# Patient Record
Sex: Female | Born: 1980 | Race: White | Hispanic: No | Marital: Married | Smoking: Current some day smoker
Health system: Southern US, Community
[De-identification: ages and names within clinical notes are randomized; demographics above are authoritative.]

## PROBLEM LIST (undated history)

## (undated) DIAGNOSIS — F419 Anxiety disorder, unspecified: Secondary | ICD-10-CM

## (undated) DIAGNOSIS — G43909 Migraine, unspecified, not intractable, without status migrainosus: Secondary | ICD-10-CM

## (undated) DIAGNOSIS — F319 Bipolar disorder, unspecified: Secondary | ICD-10-CM

## (undated) DIAGNOSIS — J3489 Other specified disorders of nose and nasal sinuses: Secondary | ICD-10-CM

## (undated) HISTORY — PX: ANKLE SURGERY: SHX546

## (undated) HISTORY — DX: Bipolar disorder, unspecified: F31.9

## (undated) HISTORY — DX: Other specified disorders of nose and nasal sinuses: J34.89

## (undated) HISTORY — DX: Anxiety disorder, unspecified: F41.9

## (undated) HISTORY — PX: NO SURGICAL HISTORY: 101002

## (undated) HISTORY — DX: Migraine, unspecified, not intractable, without status migrainosus: G43.909

---

## 2013-04-23 ENCOUNTER — Emergency Department (HOSPITAL_COMMUNITY)
Admission: EM | Admit: 2013-04-23 | Discharge: 2013-04-24 | Disposition: A | Discharge status: Home / Self Care | Payer: BC Managed Care – PPO | Attending: Emergency Medicine | Admitting: Emergency Medicine

## 2013-04-23 ENCOUNTER — Encounter (HOSPITAL_COMMUNITY): Payer: Self-pay | Admitting: *Deleted

## 2013-04-23 DIAGNOSIS — F17200 Nicotine dependence, unspecified, uncomplicated: Secondary | ICD-10-CM | POA: Insufficient documentation

## 2013-04-23 DIAGNOSIS — Z8679 Personal history of other diseases of the circulatory system: Secondary | ICD-10-CM | POA: Insufficient documentation

## 2013-04-23 DIAGNOSIS — G43909 Migraine, unspecified, not intractable, without status migrainosus: Secondary | ICD-10-CM

## 2013-04-23 DIAGNOSIS — F319 Bipolar disorder, unspecified: Secondary | ICD-10-CM | POA: Insufficient documentation

## 2013-04-23 DIAGNOSIS — Z79899 Other long term (current) drug therapy: Secondary | ICD-10-CM | POA: Insufficient documentation

## 2013-04-23 HISTORY — DX: Bipolar disorder, unspecified: F31.9

## 2013-04-23 HISTORY — DX: Migraine, unspecified, not intractable, without status migrainosus: G43.909

## 2013-04-23 MED ORDER — HYDROMORPHONE HCL PF 2 MG/ML IJ SOLN
2.0000 mg | Freq: Once | INTRAMUSCULAR | Status: AC
  Administered 2013-04-23: 2 mg via INTRAMUSCULAR
  Filled 2013-04-23: qty 1

## 2013-04-23 MED ORDER — PROMETHAZINE HCL 25 MG/ML IJ SOLN
25.0000 mg | Freq: Once | INTRAMUSCULAR | Status: AC
  Administered 2013-04-23: 25 mg via INTRAMUSCULAR
  Filled 2013-04-23: qty 1

## 2013-04-23 NOTE — ED Notes (Signed)
Pt states is in pain management for migraines; recently moved here from [place]; percocet did not come with pt; states normally percocet and nausea meds help

## 2013-04-23 NOTE — ED Notes (Signed)
No out of country travel

## 2013-04-24 MED ORDER — DEXAMETHASONE SODIUM PHOSPHATE 10 MG/ML IJ SOLN
10.0000 mg | Freq: Once | INTRAMUSCULAR | Status: AC
  Administered 2013-04-24: 10 mg via INTRAMUSCULAR
  Filled 2013-04-24: qty 1

## 2013-04-24 NOTE — ED Provider Notes (Signed)
CSN: [id number]     Arrival date & time 04/23/13  1953 History   First MD Initiated Contact with Patient 04/23/13 2156     Chief Complaint  Patient presents with  . Headache   (Consider location/radiation/quality/duration/timing/severity/associated sxs/prior Treatment) Patient is a 32 y.o. female presenting with headaches. The history is provided by the patient, the spouse and medical records. No language interpreter was used.  Headache Associated symptoms: no abdominal pain, no back pain, no cough, no diarrhea, no fatigue, no fever, no nausea, no neck stiffness and no vomiting     Michele Combs is a 32 y.o. female  with a hx of hemiplegic migraines, bipolar 1 disorder presents to the Emergency Department complaining of gradual, persistent, progressively worsening migraine headache onset 5 days ago.  Patient reports that she just moved across country on Friday and lost her medications. She states she and her husband are relocating to [place]. She reports that on Thursday of this week she has an appointment with neurology for further evaluation and treatment of her headaches. She also states that she has an appointment with pain management this coming Monday.  She reports that this migraine is like all of her others. She's had several episodes of emesis without diarrhea. She denies fever, chills, neck pain, neck stiffness, rash.  Patient has tried taking home Toradol without relief.  She reports that she only gets relief with Phenergan 50 mg and Dilaudid 3 mg IM.  Patient reports that her migraines are triggered by stress and she believes is the trigger for this migraine. She also denies abdominal pain, diarrhea, weakness, dizziness, lightheadedness, syncope, dysuria, hematuria.    Patient reports that her migraines are always hemiplegic in nature and always involve the right side. She denies having difficulty walking, loss of bowel or bladder control today.  Past Medical History  Diagnosis Date   . Migraines   . Bipolar 1 disorder    Past Surgical History  Procedure Laterality Date  . Ankle surgery     No family history on file. History  Substance Use Topics  . Smoking status: Current Some Day Smoker  . Smokeless tobacco: Not on file  . Alcohol Use: No     Comment: social   OB History   Grav Para Term Preterm Abortions TAB SAB Ect Mult Living                 Review of Systems  Constitutional: Negative for fever, diaphoresis, appetite change, fatigue and unexpected weight change.  HENT: Negative for mouth sores and neck stiffness.   Eyes: Negative for visual disturbance.  Respiratory: Negative for cough, chest tightness, shortness of breath and wheezing.   Cardiovascular: Negative for chest pain.  Gastrointestinal: Negative for nausea, vomiting, abdominal pain, diarrhea and constipation.  Endocrine: Negative for polydipsia, polyphagia and polyuria.  Genitourinary: Negative for dysuria, urgency, frequency and hematuria.  Musculoskeletal: Negative for back pain.  Skin: Negative for rash.  Allergic/Immunologic: Negative for immunocompromised state.  Neurological: Positive for headaches. Negative for syncope and light-headedness.  Hematological: Does not bruise/bleed easily.  Psychiatric/Behavioral: Negative for sleep disturbance. The patient is not nervous/anxious.     Allergies  Benadryl; Erythromycin; and Lamictal  Home Medications   Current Outpatient Rx  Name  Route  Sig  Dispense  Refill  . clonazePAM (KLONOPIN) 2 MG tablet   Oral   Take 1 mg by mouth 4 (four) times daily. scheduled         . FLUoxetine (PROZAC) 20 MG  capsule   Oral   Take 60 mg by mouth daily.         Marland Kitchen lithium carbonate (ESKALITH) 450 MG CR tablet   Oral   Take 900 mg by mouth 2 (two) times daily.         . Lurasidone HCl (LATUDA) 120 MG TABS   Oral   Take 1 tablet by mouth daily.         . ondansetron (ZOFRAN-ODT) 8 MG disintegrating tablet   Oral   Take 16 mg by  mouth every 8 (eight) hours as needed for nausea.         Marland Kitchen oxyCODONE-acetaminophen (PERCOCET) 10-325 MG per tablet   Oral   Take 2 tablets by mouth every 4 (four) hours as needed for pain.         Marland Kitchen PRESCRIPTION MEDICATION   Oral   Take 1 tablet by mouth at bedtime. Birth control. Patient or family member is not sure of the name of the medication.         . propranolol (INDERAL) 20 MG tablet   Oral   Take 20 mg by mouth 3 (three) times daily.          BP 121/81  Pulse 78  Temp(Src) 98.3 F (36.8 C) (Oral)  Resp 20  SpO2 97%  LMP 04/06/2013 Physical Exam  Nursing note and vitals reviewed. Constitutional: She is oriented to person, place, and time. She appears well-developed and well-nourished. No distress.  HENT:  Head: Normocephalic and atraumatic.  Right Ear: Hearing, tympanic membrane, external ear and ear canal normal.  Left Ear: Hearing, tympanic membrane, external ear and ear canal normal.  Nose: Nose normal. No mucosal edema or rhinorrhea.  Mouth/Throat: Uvula is midline, oropharynx is clear and moist and mucous membranes are normal. Mucous membranes are not dry. No edematous. No oropharyngeal exudate, posterior oropharyngeal edema, posterior oropharyngeal erythema or tonsillar abscesses.  Eyes: Conjunctivae and EOM are normal. Pupils are equal, round, and reactive to light. No scleral icterus.  Neck: Normal range of motion. Neck supple.  Cardiovascular: Normal rate, regular rhythm, normal heart sounds and intact distal pulses.   Pulmonary/Chest: Effort normal and breath sounds normal. No respiratory distress. She has no wheezes. She has no rales.  Abdominal: Soft. Bowel sounds are normal. There is no tenderness. There is no rebound and no guarding.  Musculoskeletal: Normal range of motion. She exhibits no tenderness.  Lymphadenopathy:    She has no cervical adenopathy.  Neurological: She is alert and oriented to person, place, and time. She has normal reflexes.  No cranial nerve deficit. She exhibits normal muscle tone. Coordination normal. GCS eye subscore is 4. GCS verbal subscore is 5. GCS motor subscore is 6.  Reflex Scores:      Tricep reflexes are 2+ on the right side and 2+ on the left side.      Bicep reflexes are 2+ on the right side and 2+ on the left side.      Brachioradialis reflexes are 2+ on the right side and 2+ on the left side.      Patellar reflexes are 2+ on the right side and 2+ on the left side.      Achilles reflexes are 2+ on the right side and 2+ on the left side. Speech is clear and goal oriented, follows commands Cranial nerves III - XII without deficit, no facial droop Normal strength lower extremities bilaterally, including strong and equal dorsiflexion and plantarflexion  Patient with  right upper extremity weakness at 3/5 including grip strength as compared to the left - patient reports this is completely normal for her migraines Sensation decreased in the right side of the face and right arm - patient also reports this is normal Moves extremities without ataxia, coordination intact Normal finger to nose and rapid alternating movements Normal gait  Skin: Skin is warm and dry. No rash noted. She is not diaphoretic.  Psychiatric: She has a normal mood and affect. Her behavior is normal. Judgment and thought content normal.    ED Course  Procedures (including critical care time) Labs Review Labs Reviewed - No data to display Imaging Review No results found.  MDM   1. Migraine headache      Michele Combs presents with migraine headache. Patient reports this is a baseline migraine for her and she presents here only because she does have abortive therapy at home. Patient reports that she needs Phenergan and Dilaudid IM for this.  I have agreed to give patient Phenergan 25 mg and Dilaudid 2 mg IM. I discussed at length with her the emergency Department is not a place that will treat headaches a regular basis.  I also  told her that this emergency Department and lives in this region do not treat migraines with her narcotic medications.  I have agreed to treat her with one time and have informed her that further providers will not give her the same medication cocktail. Advised her to talk to both pain management and neurology about the plan for further migraines.  Patient will not be discharged home with narcotic pain medications.  12:32 AM Patient reports she feels much better after treatment.  Hemiplegia resolved completely with resolution of headache.  Normal neurologic exam at this time.  Presentation is like pts typical HA and non concerning for [facility], ICH, Meningitis, or temporal arteritis. Pt is afebrile with no focal neuro deficits (after treatment), nuchal rigidity, or change in vision. Pt is to follow up with neurology and pain management to discuss prophylactic and abortive medications. Pt verbalizes understanding and is agreeable with plan to dc.   It has been determined that no acute conditions requiring further emergency intervention are present at this time. The patient/guardian have been advised of the diagnosis and plan. We have discussed signs and symptoms that warrant return to the ED, such as changes or worsening in symptoms.   Vital signs are stable at discharge.   BP 121/81  Pulse 78  Temp(Src) 98.3 F (36.8 C) (Oral)  Resp 20  SpO2 97%  LMP 04/06/2013  Patient/guardian has voiced understanding and agreed to follow-up with the PCP or specialist.        Dierdre Forth, PA-C 04/24/13 0034

## 2013-04-24 NOTE — ED Provider Notes (Signed)
Medical screening examination/treatment/procedure(s) were performed by non-physician practitioner and as supervising physician I was immediately available for consultation/collaboration.  Adrinne Sze R. Sammantha Mehlhaff, MD 04/24/13 1433 

## 2013-06-13 ENCOUNTER — Encounter (HOSPITAL_COMMUNITY): Payer: Self-pay | Admitting: Emergency Medicine

## 2013-06-13 ENCOUNTER — Emergency Department (HOSPITAL_COMMUNITY)
Admission: EM | Admit: 2013-06-13 | Discharge: 2013-06-13 | Disposition: A | Discharge status: Home / Self Care | Payer: BC Managed Care – PPO | Attending: Emergency Medicine | Admitting: Emergency Medicine

## 2013-06-13 DIAGNOSIS — T43505A Adverse effect of unspecified antipsychotics and neuroleptics, initial encounter: Secondary | ICD-10-CM | POA: Insufficient documentation

## 2013-06-13 DIAGNOSIS — T50905A Adverse effect of unspecified drugs, medicaments and biological substances, initial encounter: Secondary | ICD-10-CM

## 2013-06-13 DIAGNOSIS — F17200 Nicotine dependence, unspecified, uncomplicated: Secondary | ICD-10-CM | POA: Insufficient documentation

## 2013-06-13 DIAGNOSIS — R21 Rash and other nonspecific skin eruption: Secondary | ICD-10-CM | POA: Insufficient documentation

## 2013-06-13 DIAGNOSIS — Z79899 Other long term (current) drug therapy: Secondary | ICD-10-CM | POA: Insufficient documentation

## 2013-06-13 DIAGNOSIS — F319 Bipolar disorder, unspecified: Secondary | ICD-10-CM | POA: Insufficient documentation

## 2013-06-13 DIAGNOSIS — F411 Generalized anxiety disorder: Secondary | ICD-10-CM | POA: Insufficient documentation

## 2013-06-13 DIAGNOSIS — G43909 Migraine, unspecified, not intractable, without status migrainosus: Secondary | ICD-10-CM | POA: Insufficient documentation

## 2013-06-13 DIAGNOSIS — Z3202 Encounter for pregnancy test, result negative: Secondary | ICD-10-CM | POA: Insufficient documentation

## 2013-06-13 LAB — CBC WITH DIFFERENTIAL/PLATELET
Basophils Absolute: 0 10*3/uL (ref 0.0–0.1)
Basophils Relative: 0 % (ref 0–1)
Eosinophils Absolute: 0.2 10*3/uL (ref 0.0–0.7)
Eosinophils Relative: 1 % (ref 0–5)
Lymphocytes Relative: 29 % (ref 12–46)
MCH: 29.9 pg (ref 26.0–34.0)
MCHC: 33.1 g/dL (ref 30.0–36.0)
MCV: 90.6 fL (ref 78.0–100.0)
Monocytes Absolute: 0.8 10*3/uL (ref 0.1–1.0)
Neutrophils Relative %: 63 % (ref 43–77)
Platelets: 390 10*3/uL (ref 150–400)
RBC: 3.94 MIL/uL (ref 3.87–5.11)
RDW: 13.3 % (ref 11.5–15.5)

## 2013-06-13 LAB — BASIC METABOLIC PANEL
Calcium: 9.8 mg/dL (ref 8.4–10.5)
Creatinine, Ser: 0.89 mg/dL (ref 0.50–1.10)
GFR calc Af Amer: >90 mL/min (ref >90)
GFR calc non Af Amer: 85 mL/min — ABNORMAL LOW (ref >90)
Glucose, Bld: 102 mg/dL — ABNORMAL HIGH (ref 70–99)
Potassium: 3.9 mEq/L (ref 3.5–5.1)
Sodium: 140 mEq/L (ref 135–145)

## 2013-06-13 LAB — URINALYSIS, ROUTINE W REFLEX MICROSCOPIC
Hgb urine dipstick: NEGATIVE
Ketones, ur: NEGATIVE mg/dL
Nitrite: NEGATIVE
Protein, ur: NEGATIVE mg/dL
Specific Gravity, Urine: 1.014 (ref 1.005–1.030)
Urobilinogen, UA: 0.2 mg/dL (ref 0.0–1.0)

## 2013-06-13 LAB — POCT PREGNANCY, URINE: Preg Test, Ur: NEGATIVE

## 2013-06-13 LAB — LITHIUM LEVEL: Lithium Lvl: 0.59 mEq/L — ABNORMAL LOW (ref 0.80–1.40)

## 2013-06-13 MED ORDER — LORAZEPAM 2 MG/ML IJ SOLN
1.0000 mg | Freq: Once | INTRAMUSCULAR | Status: AC
  Administered 2013-06-13: 1 mg via INTRAVENOUS
  Filled 2013-06-13: qty 1

## 2013-06-13 MED ORDER — ETOMIDATE 2 MG/ML IV SOLN: INTRAVENOUS | Status: AC | PRN

## 2013-06-13 MED ORDER — ROCURONIUM BROMIDE 50 MG/5ML IV SOLN: INTRAVENOUS | Status: AC | PRN

## 2013-06-13 MED ORDER — HYDROXYZINE HCL 25 MG PO TABS: 25.0000 mg | ORAL_TABLET | Freq: Three times a day (TID) | ORAL | Status: DC | PRN

## 2013-06-13 MED ORDER — HYDROXYZINE HCL 25 MG PO TABS
25.0000 mg | ORAL_TABLET | Freq: Once | ORAL | Status: AC
  Administered 2013-06-13: 25 mg via ORAL
  Filled 2013-06-13: qty 1

## 2013-06-13 NOTE — ED Provider Notes (Signed)
CSN: [id number]     Arrival date & time 06/13/13  0413 History   First MD Initiated Contact with Patient 06/13/13 [phone]     Chief Complaint  Patient presents with  . Anxiety   (Consider location/radiation/quality/duration/timing/severity/associated sxs/prior Treatment) Patient is a 32 y.o. female presenting with rash.  Rash Location: arms. Quality: itchiness   Quality: not painful   Severity:  Moderate Onset quality:  Sudden Duration:  4 hours Timing:  Constant Progression:  Unchanged Chronicity:  New Context comment:  Increased dose of seroquel Relieved by:  Nothing Ineffective treatments:  Antihistamines Associated symptoms: no abdominal pain, no diarrhea, no fever, no nausea, no shortness of breath, no throat swelling, no tongue swelling and not vomiting     Past Medical History  Diagnosis Date  . Migraines   . Bipolar 1 disorder    Past Surgical History  Procedure Laterality Date  . Ankle surgery     No family history on file. History  Substance Use Topics  . Smoking status: Current Some Day Smoker  . Smokeless tobacco: Not on file  . Alcohol Use: No     Comment: social   OB History   Grav Para Term Preterm Abortions TAB SAB Ect Mult Living                 Review of Systems  Constitutional: Negative for fever.  HENT: Negative for congestion.   Respiratory: Negative for cough and shortness of breath.   Cardiovascular: Negative for chest pain.  Gastrointestinal: Negative for nausea, vomiting, abdominal pain and diarrhea.  Skin: Positive for rash.  All other systems reviewed and are negative.    Allergies  Benadryl; Erythromycin; and Lamictal  Home Medications   Current Outpatient Rx  Name  Route  Sig  Dispense  Refill  . clonazePAM (KLONOPIN) 2 MG tablet   Oral   Take 1 mg by mouth 4 (four) times daily. scheduled         . FLUoxetine (PROZAC) 20 MG capsule   Oral   Take 60 mg by mouth daily.         Marland Kitchen lithium carbonate (ESKALITH) 450 MG  CR tablet   Oral   Take 900 mg by mouth 2 (two) times daily.         . Lurasidone HCl (LATUDA) 120 MG TABS   Oral   Take 1 tablet by mouth daily.         . ondansetron (ZOFRAN-ODT) 8 MG disintegrating tablet   Oral   Take 16 mg by mouth every 8 (eight) hours as needed for nausea.         Marland Kitchen oxyCODONE-acetaminophen (PERCOCET) 10-325 MG per tablet   Oral   Take 2 tablets by mouth every 4 (four) hours as needed for pain.         Marland Kitchen PRESCRIPTION MEDICATION   Oral   Take 1 tablet by mouth at bedtime. Birth control. Patient or family member is not sure of the name of the medication.         . propranolol (INDERAL) 20 MG tablet   Oral   Take 20 mg by mouth 3 (three) times daily.         . QUEtiapine (SEROQUEL) 400 MG tablet   Oral   Take 800 mg by mouth at bedtime.          BP 115/75  Pulse 87  Temp(Src) 98.2 F (36.8 C)  Resp 20  SpO2 97%  LMP 05/30/2013  Physical Exam  Nursing note and vitals reviewed. Constitutional: She is oriented to person, place, and time. She appears well-developed and well-nourished. No distress.  HENT:  Head: Normocephalic and atraumatic.  Mouth/Throat: Oropharynx is clear and moist.  Eyes: Conjunctivae are normal. Pupils are equal, round, and reactive to light. No scleral icterus.  Neck: Neck supple.  Cardiovascular: Normal rate, regular rhythm, normal heart sounds and intact distal pulses.   No murmur heard. Pulmonary/Chest: Effort normal and breath sounds normal. No stridor. No respiratory distress. She has no rales.  Abdominal: Soft. Bowel sounds are normal. She exhibits no distension. There is no tenderness.  Musculoskeletal: Normal range of motion.  Neurological: She is alert and oriented to person, place, and time.  Skin: Skin is warm and dry. No rash noted.  Skin colored papular rash on BUEs.  Mild excoriations.   Psychiatric: She has a normal mood and affect. Her behavior is normal.    ED Course  Procedures (including  critical care time) Labs Review Labs Reviewed  CBC WITH DIFFERENTIAL - Abnormal; Notable for the following:    WBC 12.0 (*)    Hemoglobin 11.8 (*)    HCT 35.7 (*)    All other components within normal limits  BASIC METABOLIC PANEL - Abnormal; Notable for the following:    Glucose, Bld 102 (*)    GFR calc non Af Amer 85 (*)    All other components within normal limits  LITHIUM LEVEL - Abnormal; Notable for the following:    Lithium Lvl 0.59 (*)    All other components within normal limits  URINALYSIS, ROUTINE W REFLEX MICROSCOPIC  POCT PREGNANCY, URINE   Imaging Review No results found.  EKG Interpretation   None       MDM   1. Medication adverse effect, initial encounter    32 yo female with hx of anxiety who yesterday took an increased dose of seroquel yesterday after she saw her psychiatrist regarding worsening anxiety.  She appears calm, but itching.  Possibly secondary to new Seroquel dose.  She has no signs of allergic reaction (no hives, throat closing, SOB, nausea, vomiting, syncope.)  Ativan given for anxiety and atarax given for itching.     Candyce Churn, MD 06/13/13 0730

## 2013-06-13 NOTE — ED Notes (Signed)
Pt reports taking 75 mg PO Benadryl at 0400 for itching tonight.

## 2013-06-13 NOTE — ED Notes (Signed)
Pt went to psychiatrist today due to increased anxiety; states increased seroquel and klonopin; states feels worse tonight with itching

## 2013-06-13 NOTE — ED Notes (Signed)
POCT PREG resulted NEG. 

## 2013-06-13 NOTE — ED Notes (Addendum)
Another RN to give medication at this time.

## 2013-08-24 ENCOUNTER — Ambulatory Visit (INDEPENDENT_AMBULATORY_CARE_PROVIDER_SITE_OTHER): Payer: BC Managed Care – PPO

## 2013-08-24 VITALS — BP 122/64 | HR 79 | Resp 12

## 2013-08-24 DIAGNOSIS — M7750 Other enthesopathy of unspecified foot: Secondary | ICD-10-CM

## 2013-08-24 DIAGNOSIS — M79609 Pain in unspecified limb: Secondary | ICD-10-CM

## 2013-08-24 DIAGNOSIS — M779 Enthesopathy, unspecified: Secondary | ICD-10-CM

## 2013-08-24 DIAGNOSIS — M7670 Peroneal tendinitis, unspecified leg: Secondary | ICD-10-CM

## 2013-08-24 MED ORDER — MELOXICAM 15 MG PO TABS: 15.0000 mg | ORAL_TABLET | Freq: Every day | ORAL | Status: DC

## 2013-08-24 NOTE — Progress Notes (Signed)
   Subjective:    Patient ID: Michele Combs, female    DOB: 1981/02/19, 33 y.o.   MRN: [id number]  HPI '' RT FOOT ANKLE START BOTHERING ME FOR ABOUT 1 YEAR AND IS HURTING.'' Patient currently head injury requiring a screw fixation of the posterior malleolus fracture. Presents this time with some copies of x-rays or e-mail to us however only have 3 pictures and MRI and several x-ray pictures reveals screw fixation was unremarkable x-rays also detect an os trigonum which patient was told is the cause of her problems.   Review of Systems  Constitutional: Positive for unexpected weight change.  Musculoskeletal: Positive for joint swelling.  All other systems reviewed and are negative.       Objective:   Physical Exam Okay objective findings follows vascular status is intact pedal pulses palpable DP and PT +2/4 bilateral Refill timed 3-4 seconds bilateral normal plantar response DTRs not elicited dermatologically skin color pigment normal hair growth absent there is a scar from previous surgery posterior lateral right ankle. X-rays do reveal intact screw fixation in the posterior malleolus. 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 There is no medial ankle pain however there is significant tenderness along the lateral malleolar area along the distribution of a post peroneal tendons extending even to the level of the fifth metatarsal base. Posse some soft tissue swelling noted and on active and passive range of motion exercises patient has significant pain on utilizing her peroneal tendon and the version and/or flexion or plantarflexion. Inversion was not painful or passive inversion was tender when she tried resistant to this is significant for possible peroneal tendon injury cannot rule out split tear. The MRI pictures that we have are limited and show no significant  finding correlates with her pain at this time.       Assessment & Plan:  Assessment peroneal tendinitis with possible capsulitis lateral ankle at this time patient placed and ankle stabilizer for the next one to 2 months also prescription for Mobic is dispensed recommend a warm compresses and ice pack and moderated activities notable listed 50s running or jumping treadmill and bicycle or water activities or allowed. Reappointed in 2 months for followup and reevaluation and be candidate for future MRI to reevaluate for possible peroneal tendon injury also discussed possibly some of the pain may be associated with reaction to the metal screw fixation. Extra graft Alvan Dameichard Yurani Fettes DPM

## 2013-08-24 NOTE — Patient Instructions (Signed)
ICE INSTRUCTIONS  Apply ice or cold pack to the affected area at least 3 times a day for 10-15 minutes each time.  You should also use ice after prolonged activity or vigorous exercise.  Do not apply ice longer than 20 minutes at one time.  Always keep a cloth between your skin and the ice pack to prevent burns.  Being consistent and following these instructions will help control your symptoms.  We suggest you purchase a gel ice pack because they are reusable and do bit leak.  Some of them are designed to wrap around the area.  Use the method that works best for you.  Here are some other suggestions for icing.   Use a frozen bag of peas or corn-inexpensive and molds well to your body, usually stays frozen for 10 to 20 minutes.  Wet a towel with cold water and squeeze out the excess until it's damp.  Place in a bag in the freezer for 20 minutes. Then remove and use.  Alternate hot warm compress for 15 minutes and the ice for 15 minutes repeat this process 2 or 3 times daily for the next 2-4 weeks

## 2013-09-01 ENCOUNTER — Emergency Department (HOSPITAL_COMMUNITY)
Admission: EM | Admit: 2013-09-01 | Discharge: 2013-09-01 | Disposition: A | Discharge status: Home / Self Care | Payer: BC Managed Care – PPO | Attending: Emergency Medicine | Admitting: Emergency Medicine

## 2013-09-01 ENCOUNTER — Encounter (HOSPITAL_COMMUNITY): Payer: Self-pay | Admitting: Emergency Medicine

## 2013-09-01 DIAGNOSIS — F17200 Nicotine dependence, unspecified, uncomplicated: Secondary | ICD-10-CM | POA: Insufficient documentation

## 2013-09-01 DIAGNOSIS — G43909 Migraine, unspecified, not intractable, without status migrainosus: Secondary | ICD-10-CM | POA: Insufficient documentation

## 2013-09-01 DIAGNOSIS — R0789 Other chest pain: Secondary | ICD-10-CM | POA: Insufficient documentation

## 2013-09-01 DIAGNOSIS — F319 Bipolar disorder, unspecified: Secondary | ICD-10-CM | POA: Insufficient documentation

## 2013-09-01 DIAGNOSIS — Z79899 Other long term (current) drug therapy: Secondary | ICD-10-CM | POA: Insufficient documentation

## 2013-09-01 DIAGNOSIS — T50905A Adverse effect of unspecified drugs, medicaments and biological substances, initial encounter: Secondary | ICD-10-CM

## 2013-09-01 DIAGNOSIS — T43505A Adverse effect of unspecified antipsychotics and neuroleptics, initial encounter: Secondary | ICD-10-CM | POA: Insufficient documentation

## 2013-09-01 MED ORDER — HYDROXYZINE HCL 25 MG PO TABS
50.0000 mg | ORAL_TABLET | Freq: Once | ORAL | Status: AC
  Administered 2013-09-01: 50 mg via ORAL
  Filled 2013-09-01: qty 2

## 2013-09-01 MED ORDER — LORAZEPAM 2 MG/ML IJ SOLN
1.0000 mg | Freq: Once | INTRAMUSCULAR | Status: AC
  Administered 2013-09-01: 1 mg via INTRAVENOUS
  Filled 2013-09-01: qty 1

## 2013-09-01 MED ORDER — LORAZEPAM 1 MG PO TABS
1.0000 mg | ORAL_TABLET | Freq: Once | ORAL | Status: DC
  Filled 2013-09-01: qty 1

## 2013-09-01 MED ORDER — HYDROXYZINE HCL 25 MG PO TABS: 25.0000 mg | ORAL_TABLET | Freq: Four times a day (QID) | ORAL | Status: DC

## 2013-09-01 MED ORDER — ALBUTEROL SULFATE HFA 108 (90 BASE) MCG/ACT IN AERS
2.0000 | INHALATION_SPRAY | RESPIRATORY_TRACT | Status: DC | PRN
  Administered 2013-09-01: 2 via RESPIRATORY_TRACT
  Filled 2013-09-01: qty 6.7

## 2013-09-01 NOTE — ED Provider Notes (Signed)
CSN: [id number]     Arrival date & time 09/01/13  0000 History   First MD Initiated Contact with Patient 09/01/13 0052     Chief Complaint  Patient presents with  . Allergic Reaction     (Consider location/radiation/quality/duration/timing/severity/associated sxs/prior Treatment) HPI History provided by patient. Has history of migraines and bipolar. Has been taking a new medication for the last month, saphris. Tonight at home developed itching upper extremities and some chest tightness and believes that she is allergic to this medication.  Had previous reaction to Seroquel with similar symptoms. At that time she received Vistaril which helped and today she is requesting the same. No rash. No wheezing. No leg pain or leg swelling. No other known new exposures including foods, medications, soaps or detergents. Symptoms moderate in severity. Past Medical History  Diagnosis Date  . Migraines   . Bipolar 1 disorder    Past Surgical History  Procedure Laterality Date  . Ankle surgery     History reviewed. No pertinent family history. History  Substance Use Topics  . Smoking status: Current Some Day Smoker  . Smokeless tobacco: Not on file  . Alcohol Use: No     Comment: social   OB History   Grav Para Term Preterm Abortions TAB SAB Ect Mult Living                 Review of Systems  Constitutional: Negative for fever and chills.  Eyes: Negative for visual disturbance.  Respiratory: Negative for shortness of breath.   Cardiovascular: Negative for chest pain.  Gastrointestinal: Negative for vomiting and abdominal pain.  Genitourinary: Negative for dysuria.  Musculoskeletal: Negative for back pain and neck stiffness.  Skin: Negative for rash.  Neurological: Negative for syncope, weakness and headaches.  All other systems reviewed and are negative.      Allergies  Benadryl; Erythromycin; Lamictal; Saphris; and Seroquel  Home Medications   Current Outpatient Rx  Name   Route  Sig  Dispense  Refill  . clonazePAM (KLONOPIN) 2 MG tablet   Oral   Take 2 mg by mouth 3 (three) times daily as needed. scheduled         . doxepin (SINEQUAN) 10 MG/ML solution   Oral   Take 150 mg by mouth at bedtime.          Marland Kitchen. FLUoxetine (PROZAC) 20 MG capsule   Oral   Take 60 mg by mouth daily.         . hydrOXYzine (ATARAX/VISTARIL) 25 MG tablet   Oral   Take 1 tablet (25 mg total) by mouth every 8 (eight) hours as needed for anxiety or itching.   12 tablet   0   . LEVONEST tablet   Oral   Take 1 tablet by mouth daily.          . Lurasidone HCl (LATUDA) 120 MG TABS   Oral   Take 1 tablet by mouth daily.         . ondansetron (ZOFRAN-ODT) 8 MG disintegrating tablet   Oral   Take 16 mg by mouth every 8 (eight) hours as needed for nausea.         Marland Kitchen. oxyCODONE-acetaminophen (PERCOCET) 10-325 MG per tablet   Oral   Take 2 tablets by mouth every 4 (four) hours as needed for pain.         Marland Kitchen. propranolol (INDERAL) 20 MG tablet   Oral   Take 20 mg by mouth 3 (three) times  daily.         . zolmitriptan (ZOMIG) 5 MG tablet   Oral   Take 5 mg by mouth as needed.          . zonisamide (ZONEGRAN) 100 MG capsule   Oral   Take 100 mg by mouth daily.          Marland Kitchen asenapine (SAPHRIS) 5 MG SUBL 24 hr tablet   Sublingual   Place 10 mg under the tongue 2 (two) times daily.          BP 133/76  Pulse 86  Temp(Src) 97.8 F (36.6 C) (Oral)  Resp 18  Ht 5' 4.5" (1.638 m)  Wt 222 lb (100.699 kg)  BMI 37.53 kg/m2  SpO2 98%  LMP 08/15/2013 Physical Exam  Constitutional: She is oriented to person, place, and time. She appears well-developed and well-nourished.  HENT:  Head: Normocephalic and atraumatic.  Mouth/Throat: Oropharynx is clear and moist.  Uvula midline  Eyes: EOM are normal. Pupils are equal, round, and reactive to light.  Neck: Neck supple.  Cardiovascular: Normal rate, regular rhythm and intact distal pulses.   Pulmonary/Chest:  Effort normal and breath sounds normal. No stridor. No respiratory distress. She has no wheezes.  Abdominal: Soft. Bowel sounds are normal. She exhibits no distension. There is no tenderness.  Musculoskeletal: Normal range of motion. She exhibits no edema and no tenderness.  Neurological: She is alert and oriented to person, place, and time. She displays normal reflexes. No cranial nerve deficit. Coordination normal.  No involuntary movements, MAE x 4 appropriately  Skin: Skin is warm and dry. No rash noted.  Psychiatric: She has a normal mood and affect. Her behavior is normal. Judgment normal.    ED Course  Procedures (including critical care time) Labs Review  Vistaril, albuterol provided Patient feeling anxious requesting something for her nerves, Ativan given  On recheck feels improved. Plan discharge home with prescription for Vistaril close physician followup. Patient will stop Saphris.    MDM   Diagnosis: Adverse drug reaction  No anaphylaxis. Treated with medications as above and condition improved Vital signs / nursing notes reviewed and considered    Sunnie Nielsen, MD 09/01/13 [phone]

## 2013-09-01 NOTE — Discharge Instructions (Signed)

## 2013-09-01 NOTE — ED Notes (Signed)
Pt reports shortness of breath, chest tightness, itching and involuntary spasms that began approx 3hrs ago - pt is attributing her symptoms to an allergic reaction from her bipolar medication - Saphis. Pt A&Ox4, no acute resp distress, handling secretions and speaking complete sentences.

## 2013-09-03 ENCOUNTER — Other Ambulatory Visit: Payer: Self-pay | Admitting: Neurology

## 2013-09-03 DIAGNOSIS — R51 Headache: Principal | ICD-10-CM

## 2013-09-03 DIAGNOSIS — R519 Headache, unspecified: Secondary | ICD-10-CM

## 2013-09-05 ENCOUNTER — Other Ambulatory Visit: Payer: Self-pay

## 2013-09-05 ENCOUNTER — Telehealth: Payer: Self-pay | Admitting: *Deleted

## 2013-09-05 MED ORDER — MELOXICAM 15 MG PO TABS: 15.0000 mg | ORAL_TABLET | Freq: Every day | ORAL | Status: DC

## 2013-09-05 NOTE — Telephone Encounter (Signed)
Sent in Meloxicam Rx to [facility] as requested. Attempted to inform pt that this was done but her VM was "full" so unable to leave a message.

## 2013-09-13 ENCOUNTER — Other Ambulatory Visit: Payer: BC Managed Care – PPO

## 2013-10-19 ENCOUNTER — Ambulatory Visit: Payer: BC Managed Care – PPO

## 2014-03-22 ENCOUNTER — Encounter (HOSPITAL_COMMUNITY): Payer: Self-pay | Admitting: Emergency Medicine

## 2014-03-22 ENCOUNTER — Emergency Department (HOSPITAL_COMMUNITY)
Admission: EM | Admit: 2014-03-22 | Discharge: 2014-03-23 | Disposition: A | Discharge status: Home / Self Care | Payer: Medicare Other | Attending: Emergency Medicine | Admitting: Emergency Medicine

## 2014-03-22 DIAGNOSIS — Z3202 Encounter for pregnancy test, result negative: Secondary | ICD-10-CM | POA: Insufficient documentation

## 2014-03-22 DIAGNOSIS — Z79899 Other long term (current) drug therapy: Secondary | ICD-10-CM | POA: Insufficient documentation

## 2014-03-22 DIAGNOSIS — Z87891 Personal history of nicotine dependence: Secondary | ICD-10-CM | POA: Insufficient documentation

## 2014-03-22 DIAGNOSIS — F319 Bipolar disorder, unspecified: Secondary | ICD-10-CM | POA: Insufficient documentation

## 2014-03-22 DIAGNOSIS — R112 Nausea with vomiting, unspecified: Secondary | ICD-10-CM | POA: Insufficient documentation

## 2014-03-22 DIAGNOSIS — G43909 Migraine, unspecified, not intractable, without status migrainosus: Secondary | ICD-10-CM | POA: Insufficient documentation

## 2014-03-22 DIAGNOSIS — R519 Headache, unspecified: Secondary | ICD-10-CM

## 2014-03-22 DIAGNOSIS — Z9889 Other specified postprocedural states: Secondary | ICD-10-CM

## 2014-03-22 DIAGNOSIS — R51 Headache: Secondary | ICD-10-CM

## 2014-03-22 LAB — CBC
HCT: 40.2 % (ref 36.0–46.0)
Hemoglobin: 13 g/dL (ref 12.0–15.0)
MCH: 30.5 pg (ref 26.0–34.0)
MCHC: 32.3 g/dL (ref 30.0–36.0)
MCV: 94.4 fL (ref 78.0–100.0)
Platelets: 379 10*3/uL (ref 150–400)
RBC: 4.26 MIL/uL (ref 3.87–5.11)
RDW: 12.8 % (ref 11.5–15.5)
WBC: 10.4 10*3/uL (ref 4.0–10.5)

## 2014-03-22 LAB — BASIC METABOLIC PANEL
Anion gap: 17 — ABNORMAL HIGH (ref 5–15)
BUN: 15 mg/dL (ref 6–23)
CO2: 21 meq/L (ref 19–32)
Calcium: 9.9 mg/dL (ref 8.4–10.5)
Chloride: 103 mEq/L (ref 96–112)
Creatinine, Ser: 0.73 mg/dL (ref 0.50–1.10)
GFR calc Af Amer: >90 mL/min (ref >90)
Glucose, Bld: 83 mg/dL (ref 70–99)
POTASSIUM: 4.5 meq/L (ref 3.7–5.3)
SODIUM: 141 meq/L (ref 137–147)

## 2014-03-22 LAB — POC URINE PREG, ED: PREG TEST UR: NEGATIVE

## 2014-03-22 MED ORDER — PROMETHAZINE HCL 25 MG PO TABS
12.5000 mg | ORAL_TABLET | ORAL | Status: AC
  Administered 2014-03-22: 12.5 mg via ORAL

## 2014-03-22 MED ORDER — HYDROMORPHONE HCL PF 1 MG/ML IJ SOLN
1.0000 mg | Freq: Once | INTRAMUSCULAR | Status: AC
  Administered 2014-03-22: 1 mg via INTRAVENOUS
  Filled 2014-03-22: qty 1

## 2014-03-22 MED ORDER — SODIUM CHLORIDE 0.9 % IV BOLUS (SEPSIS)
1000.0000 mL | Freq: Once | INTRAVENOUS | Status: AC
  Administered 2014-03-22: 1000 mL via INTRAVENOUS

## 2014-03-22 MED ORDER — PROMETHAZINE HCL 25 MG/ML IJ SOLN
12.5000 mg | Freq: Once | INTRAMUSCULAR | Status: AC
  Administered 2014-03-22: 12.5 mg via INTRAVENOUS
  Filled 2014-03-22: qty 1

## 2014-03-22 MED ORDER — HYDROMORPHONE HCL PF 2 MG/ML IJ SOLN
2.0000 mg | Freq: Once | INTRAMUSCULAR | Status: AC
  Administered 2014-03-22: 2 mg via INTRAVENOUS
  Filled 2014-03-22: qty 1

## 2014-03-22 NOTE — ED Provider Notes (Signed)
CSN: [id number]     Arrival date & time 03/22/14  1930 History   First MD Initiated Contact with Patient 03/22/14 2119     Chief Complaint  Patient presents with  . Migraine     (Consider location/radiation/quality/duration/timing/severity/associated sxs/prior Treatment) HPI Comments: The patient is a 33 year old female past no history of migraines, bipolar disorder, presents emergency room chief complaint of headache since today. The patient reports after undergoing ECT she had a headache. She reports 20-30 previous ECT therapy is in the past. She reports she was given Toradol and tramadol without full resolution of symptoms. The patient reports she has had a migraine unrelieved by tramadol and Ultram in the past after ECT therapy she has required narcotic pain medication. She reports multiple episodes of vomiting no abdominal discomfort. No visual, weakness, numbness. LNMP: now. Neurologist in [place], rest of medical care in [place].   The history is provided by the patient. No language interpreter was used.    Past Medical History  Diagnosis Date  . Migraines   . Bipolar 1 disorder    Past Surgical History  Procedure Laterality Date  . Ankle surgery     No family history on file. History  Substance Use Topics  . Smoking status: Former Games developer  . Smokeless tobacco: Not on file  . Alcohol Use: Yes     Comment: social   OB History   Grav Para Term Preterm Abortions TAB SAB Ect Mult Living                 Review of Systems  Constitutional: Negative for fever and chills.  Eyes: Negative for visual disturbance.  Gastrointestinal: Positive for nausea and vomiting. Negative for abdominal pain and diarrhea.  Musculoskeletal: Negative for neck pain.  Neurological: Positive for headaches. Negative for dizziness, syncope, facial asymmetry, speech difficulty, weakness and light-headedness.      Allergies  Benadryl; Erythromycin; Lamictal; Saphris; and Seroquel  Home  Medications   Prior to Admission medications   Medication Sig Start Date End Date Taking? Authorizing Provider  clonazePAM (KLONOPIN) 2 MG tablet Take 2 mg by mouth 3 (three) times daily as needed. scheduled   Yes Historical Provider, MD  doxepin (SINEQUAN) 10 MG/ML solution Take 150 mg by mouth at bedtime as needed for sleep.  07/24/13  Yes Historical Provider, MD  FLUoxetine (PROZAC) 20 MG capsule Take 80 mg by mouth daily.    Yes Historical Provider, MD  LEVONEST tablet Take 1 tablet by mouth daily.  08/12/13  Yes Historical Provider, MD  lithium carbonate (ESKALITH) 450 MG CR tablet Take 900 mg by mouth 2 (two) times daily.   Yes Historical Provider, MD  ondansetron (ZOFRAN-ODT) 8 MG disintegrating tablet Take 16 mg by mouth every 8 (eight) hours as needed for nausea.   Yes Historical Provider, MD  oxyCODONE-acetaminophen (PERCOCET) 10-325 MG per tablet Take 2 tablets by mouth every 4 (four) hours as needed for pain.   Yes Historical Provider, MD  propranolol (INDERAL) 20 MG tablet Take 20 mg by mouth 3 (three) times daily.   Yes Historical Provider, MD  zolpidem (AMBIEN) 10 MG tablet Take 10 mg by mouth at bedtime as needed for sleep.  02/27/14  Yes Historical Provider, MD   BP 139/96  Pulse 72  Temp(Src) 98.8 F (37.1 C) (Oral)  Resp 18  Ht  (1.626 m)  Wt 180 lb (81.647 kg)  BMI 30.88 kg/m2  SpO2 100%  LMP 03/17/2014 Physical Exam  Nursing note and  vitals reviewed. Constitutional: She is oriented to person, place, and time. She appears well-developed and well-nourished. No distress.  HENT:  Head: Normocephalic and atraumatic.  Eyes: EOM are normal. Pupils are equal, round, and reactive to light.  Dilated pupils bilaterally  Neck: Normal range of motion. Neck supple.  Cardiovascular: Normal rate and regular rhythm.   Pulmonary/Chest: Effort normal and breath sounds normal. No respiratory distress. She has no wheezes. She has no rales.  Abdominal: Soft. There is no tenderness.  There is no rebound.  Neurological: She is oriented to person, place, and time. No cranial nerve deficit. Coordination normal.  Speech is clear and goal oriented, follows commands Cranial nerves III - XII grossly intact, no facial droop Normal strength in upper and lower extremities bilaterally, strong and equal grip strength Sensation normal to light and sharp touch Moves all 4 extremities without ataxia, coordination intact Normal finger to nose and rapid alternating movements Normal gait without assistance.  Skin: Skin is warm and dry. She is not diaphoretic.  Psychiatric: Her behavior is normal.    ED Course  Procedures (including critical care time) Labs Review Results for orders placed during the hospital encounter of 03/22/14  CBC      Result Value Ref Range   WBC 10.4  4.0 - 10.5 K/uL   RBC 4.26  3.87 - 5.11 MIL/uL   Hemoglobin 13.0  12.0 - 15.0 g/dL   HCT 16.1  09.6 - 04.5 %   MCV 94.4  78.0 - 100.0 fL   MCH 30.5  26.0 - 34.0 pg   MCHC 32.3  30.0 - 36.0 g/dL   RDW 40.9  81.1 - 91.4 %   Platelets 379  150 - 400 K/uL  BASIC METABOLIC PANEL      Result Value Ref Range   Sodium 141  137 - 147 mEq/L   Potassium 4.5  3.7 - 5.3 mEq/L   Chloride 103  96 - 112 mEq/L   CO2 21  19 - 32 mEq/L   Glucose, Bld 83  70 - 99 mg/dL   BUN 15  6 - 23 mg/dL   Creatinine, Ser 7.82  0.50 - 1.10 mg/dL   Calcium 9.9  8.4 - 95.6 mg/dL   GFR calc non Af Amer >90  >90 mL/min   GFR calc Af Amer >90  >90 mL/min   Anion gap 17 (*) 5 - 15  POC URINE PREG, ED      Result Value Ref Range   Preg Test, Ur NEGATIVE  NEGATIVE   No results found  Imaging Review No results found.   EKG Interpretation None      MDM   Final diagnoses:  Acute nonintractable headache, unspecified headache type  History of electroconvulsive therapy   Patient presents after ECT treatment with migraine, and vomiting, similar to previous. Afebrile. Neurology is in [place] no neurologic deficits on exam.  Patient has had Ultram and tramadol prior to arrival. To treat nausea and headache. And reassess. Labs show anion gap of 17 likely do to vomiting patient is getting IV fluids at this time. Reevaluation patient resting comfortably in room reports mild resolution with Dilaudid reports persistent nausea and pain. No further emesis. 2350 reevaluation patient resting in room reports resolution of headache, requesting to be discharged home. Discussed lab results, and treatment plan with the patient. Resources given. Return precautions given. Reports understanding and no other concerns at this time.  Patient is stable for discharge at this time.  Meds  given in ED:  Medications  sodium chloride 0.9 % bolus 1,000 mL (1,000 mLs Intravenous New Bag/Given 03/22/14 2215)  HYDROmorphone (DILAUDID) injection 1 mg (1 mg Intravenous Given 03/22/14 2222)  promethazine (PHENERGAN) injection 12.5 mg (12.5 mg Intravenous Given 03/22/14 2223)  promethazine (PHENERGAN) tablet 12.5 mg (12.5 mg Oral Given 03/22/14 2340)  HYDROmorphone (DILAUDID) injection 2 mg (2 mg Intravenous Given 03/22/14 2341)    New Prescriptions   No medications on file      Mellody Drown, PA-C 03/23/14 0015

## 2014-03-22 NOTE — ED Notes (Signed)
Pt requesting to have Phenergan and Dilaudid IM instead of IV. Mellody Drown PA says that's fine.

## 2014-03-22 NOTE — ED Notes (Signed)
Pt presents with migraine that began post ECT treatment this am @ 0900. +n/v. +light and sound sensitivity.

## 2014-03-22 NOTE — Discharge Instructions (Signed)
Call for a follow up appointment with a Family or Primary Care Provider.  °Return if Symptoms worsen.   °Take medication as prescribed.  °Drink plenty of fluids. ° ° °Emergency Department Resource Guide °1) Find a Doctor and Pay Out of Pocket °Although you won't have to find out who is covered by your insurance plan, it is a good idea to ask around and get recommendations. You will then need to call the office and see if the doctor you have chosen will accept you as a new patient and what types of options they offer for patients who are self-pay. Some doctors offer discounts or will set up payment plans for their patients who do not have insurance, but you will need to ask so you aren't surprised when you get to your appointment. ° °2) Contact Your Local Health Department °Not all health departments have doctors that can see patients for sick visits, but many do, so it is worth a call to see if yours does. If you don't know where your local health department is, you can check in your phone book. The CDC also has a tool to help you locate your state's health department, and many state websites also have listings of all of their local health departments. ° °3) Find a Walk-in Clinic °If your illness is not likely to be very severe or complicated, you may want to try a walk in clinic. These are popping up all over the country in pharmacies, drugstores, and shopping centers. They're usually staffed by nurse practitioners or physician assistants that have been trained to treat common illnesses and complaints. They're usually fairly quick and inexpensive. However, if you have serious medical issues or chronic medical problems, these are probably not your best option. ° °No Primary Care Doctor: °- Call [facility] Connect at  832-8000 - they can help you locate a primary care doctor that  accepts your insurance, provides certain services, etc. °- Physician Referral Service- 1-800-533-3463 ° °Chronic Pain  Problems: °Organization         Address  Phone   Notes  °[facility]  (336) [phone] Patients need to be referred by their primary care doctor.  ° °Medication Assistance: °Organization         Address  Phone   Notes  °[place] [facility] [address] °[place], [place] [address] (336) 641-8030 --Must be a resident of [place] °-- Must have NO insurance coverage whatsoever (no Medicaid/ Medicare, etc.) °-- The pt. MUST have a primary care doctor that directs their care regularly and follows them in the community °  °[facility]  (866) 331-1348   °[employer]  (888) 892-1162   ° °Agencies that provide inexpensive medical care: °Organization         Address  Phone   Notes  °[facility]  (336) 832-8035   °[facility]    (336) 832-7272   °[facility] [address] °[place], [place] [address] (336) 832-4777   °[facility] [address], °[place] (336) 271-4999   °[facility]    (336) 373-0678   °[facility]    (336) 272-1050   °[facility] ° [address],  Phone:  (336) 832-4444, Fax:  (336) 832-4440 Hours of Operation:  9 am - 6 pm, M-F.  Also accepts Medicaid/Medicare and self-pay.  °[facility] ° [address], [place] Phone: (336) [phone], Fax: (336) [phone]. Hours of Operation:  8:30 am - 5:30 pm, M-F.  Also accepts Medicaid and self-pay.  °[facility] [place] [address], [place] Phone: (336) 878-6027   °[facility] [address], [place], [place] (336)723-1848, Ext. 123 Mondays & Thursdays: 7-9 AM.  First 15 patients are seen on a first come, first serve basis. °  ° °Medicaid-accepting [place] Providers: ° °Organization         Address  Phone   Notes  °[facility] [address], [place] (336) 641-2100 Also  accepts self-pay patients.  °[facility] [address], [place] ° (336) 856-9996   °[facility] [address], [place] (336) 288-8857   °[facility] [address] [place] [address], [place] (336) 299-7000   °Veita Bland [address], [place]  ° (336) 373-1557 Only accepts [place] Access Medicaid patients after they have their name applied to their card.  ° °Self-Pay (no insurance) in [place]: ° °Organization         Address  Phone   Notes  °Sickle Cell Patients, [facility] [address], [place] (336) 832-1970   °[facility] [address], [place] (336) 832-4400   °[facility] ° [address], [place] (336) 992-4800   °[facility]/Dr. Osei-Bonsu ° [address] [place] [address], [place] or [address], [place] (336) 841-8500 Phone number for both [place] and [place] locations is the same.  °[facility] [address], [place] (336) 299-0000   °[facility] [address] [place] [address], [place] or [address] (336) 852-7530 °(336) 878-2260   °[facility] [address], [place] (336) 350-1642, phone; (336) 294-5005, fax Sees patients 1st and 3rd Saturday of every month.  Must not qualify for public or private insurance (i.e. Medicaid, Medicare, [place] Health Choice, Veterans' Benefits) • Household income should be no more than 200% of the poverty level •The clinic cannot treat you if you are pregnant or think you are pregnant • Sexually transmitted diseases are not treated at the clinic.  ° ° °Dental Care: °Organization         Address  Phone  Notes  °[facility] [address], [place] (336) 641-6152 Accepts children up to age 21 who are enrolled in Medicaid or [place] Health Choice; pregnant  women with a Medicaid card; and children who have applied for Medicaid or [place] Health Choice, but were declined, whose parents can pay a reduced fee at time of service.  °[facility] [place]  [address], [place] (336) 641-7733 Accepts children up to age 21 who are enrolled in Medicaid or [place] Health Choice; pregnant women with a Medicaid card; and children who have applied for Medicaid or  Health Choice, but were declined, whose parents can pay a reduced fee at time of service.  °[facility] ° [address], [place] (336) 641-4533 Patients are seen by appointment only. Walk-ins are not accepted. [facility] will see patients 18 years of age and older. °Monday - Tuesday (8am-5pm) °Most Wednesdays (8:30-5pm) °$30 per visit, cash only  °[facility] °   [address], [place] (336) 641-4533 Patients are seen by appointment only. Walk-ins are not accepted. [facility] will see patients 18 years of age and older. °One Wednesday Evening (Monthly: Volunteer Based).  $30 per visit, cash only  °[facility]  (919) 537-3737 for adults; Children under age 4, call Graduate Pediatric Dentistry at (919) [phone]. Children aged 4-14, please call (919) [phone] to request a pediatric application. ° Dental services are provided in all areas of dental care including fillings, crowns and bridges, complete and partial dentures, implants, gum treatment, root canals, and extractions. Preventive care is also provided. Treatment is provided to both adults and children. °Patients are selected via a lottery and there is often a waiting list. °  °[facility] [address], °[place] ° (336) 763-8833 [email or web address] °  °[facility] [address], [place], [place] (336)723-1848, Ext. 123 Second and Fourth Thursday of each month, opens at 6:30 AM; Clinic ends at 9 AM.  Patients are  seen on a first-come first-served basis, and a limited number are seen during each clinic.  ° °[facility] ° [address], [place], [place] (336) 723-7904   Eligibility Requirements °You must have lived in [place], [place], or [place] counties for at least the last three months. °  You cannot be eligible for state or federal sponsored healthcare insurance, including Veterans Administration, Medicaid, or Medicare. °  You generally cannot be eligible for healthcare insurance through your employer.  °  How to apply: °Eligibility screenings are held every Tuesday and Wednesday afternoon from 1:00 pm until 4:00 pm. You do not need an appointment for the interview!  °[facility] [address], [place], [place] 336-631-2330   °[facility]  336-342-8273   °[facility]  336-703-3100   °[facility]  336-570-6415   ° °Behavioral Health Resources in the Community: °Intensive Outpatient Programs °Organization         Address  Phone  Notes  °[facility] [address], [place], [place] 336-878-6098   °[facility] [address], [place], [place] 336-832-9800   °[facility] [address], [place], [place] ° 336-882-2125   °[facility] [address],  °[place], [place] 1-800-853-5163 or 336-641-4981   °Substance Abuse Resources °Organization         Address  Phone  Notes  °[facility]  336-882-2125   °[facility]  336-784-9470   °[facility]  336-285-9073   °[facility]  336-845-3988   °Residential & Outpatient Substance Abuse Program  1-800-659-3381   °Psychological Services °Organization         Address  Phone  Notes  °[facility]  336- 832-9600   °[facility]  336- 378-7881   °[facility] [address], [place] 1-800-853-5163 or 336-641-4981   ° °Mobile Crisis  Teams °Organization         Address  Phone  Notes  °[facility], Mobile Crisis Care Unit  1-877-626-1772   °Assertive °[facility] ° [address] [place], [place] 336-834-9664   °Sharon DeEsch [address] °[place] 336-554-5454   ° °Self-Help/Support Groups °Organization         Address  Phone             Notes  °[facility]  -   variety of support groups  336- 373-1402 Call for more information  °Narcotics Anonymous (NA), [facility] [address], °[place]  2 meetings at this location  ° °Residential Treatment Programs °Organization         Address  Phone  Notes  °[facility] [address],    °[place]  1-866-801-8205   °[facility] ° [address], [place], [place] 704-293-8524   °[facility] [address], [place] 336-845-3988 Admissions: 8am-3pm M-F  °[facility] [address]    °[place], [place] 336-841-1104   °[facility] [address], [place], [place] 336-379-7146   °[facility] [address]  °[place], [place] 336-285-9073   °[facility] - Intensive Outpatient [address], [place], [place] 336-852-3033   °[facility] [address]  °[place], [place] 1-877-615-2722 or 336-784-9470   °[facility] [address] [place], [place] 336-227-7417 Accepts Medicaid  °[facility] [address]  °[place] 1-800-659-3381 Substance Abuse/Addiction Treatment  ° °[place] Behavioral Health Resources °Organization         Address  Phone  Notes  °[facility]  (888) 581-9988   °Julie Brannon, PhD [address] [place], [place]   (336) 349-5553 or (336) 951-0000   °[facility]   [address] °[place], [place] (336) 349-4454   °[facility] [address], [place], [place] (336) 342-8316  Insurance/Medicaid/sponsorship through [facility]  °[facility] [address]                                    [place], [place] (336) 342-8316 Therapy/tele-psych/case  °[facility] [address]  ° [place], [place] (336) 349-2233    °Dr. Arfeen  (336) 349-4544   °[facility] [place]  [employer] [place] [facility] 1) [address], [place] °2) [address], [place] °3)  [address], [place] (336) 349-3220 °(336) 342-7768 ° °(336) 342-8140   °[place] [facility] (336) 342-1394 or (336) 342-3537 (After Hours)    ° ° ° °

## 2014-03-23 NOTE — ED Provider Notes (Signed)
Medical screening examination/treatment/procedure(s) were performed by non-physician practitioner and as supervising physician I was immediately available for consultation/collaboration.   EKG Interpretation None        Layla Maw Saron Vanorman, DO 03/23/14 1610

## 2014-05-30 ENCOUNTER — Emergency Department (HOSPITAL_COMMUNITY)
Admission: EM | Admit: 2014-05-30 | Discharge: 2014-05-31 | Disposition: A | Discharge status: Home / Self Care | Payer: Medicare Other | Attending: Emergency Medicine | Admitting: Emergency Medicine

## 2014-05-30 ENCOUNTER — Emergency Department (HOSPITAL_COMMUNITY): Payer: Medicare Other

## 2014-05-30 ENCOUNTER — Encounter (HOSPITAL_COMMUNITY): Payer: Self-pay | Admitting: *Deleted

## 2014-05-30 DIAGNOSIS — F319 Bipolar disorder, unspecified: Secondary | ICD-10-CM | POA: Diagnosis not present

## 2014-05-30 DIAGNOSIS — Z8701 Personal history of pneumonia (recurrent): Secondary | ICD-10-CM | POA: Insufficient documentation

## 2014-05-30 DIAGNOSIS — G43909 Migraine, unspecified, not intractable, without status migrainosus: Secondary | ICD-10-CM | POA: Diagnosis present

## 2014-05-30 DIAGNOSIS — Z79899 Other long term (current) drug therapy: Secondary | ICD-10-CM | POA: Insufficient documentation

## 2014-05-30 DIAGNOSIS — R05 Cough: Secondary | ICD-10-CM | POA: Insufficient documentation

## 2014-05-30 DIAGNOSIS — R0602 Shortness of breath: Secondary | ICD-10-CM | POA: Diagnosis not present

## 2014-05-30 DIAGNOSIS — G43809 Other migraine, not intractable, without status migrainosus: Secondary | ICD-10-CM | POA: Diagnosis not present

## 2014-05-30 DIAGNOSIS — Z720 Tobacco use: Secondary | ICD-10-CM | POA: Diagnosis not present

## 2014-05-30 DIAGNOSIS — R059 Cough, unspecified: Secondary | ICD-10-CM

## 2014-05-30 DIAGNOSIS — R1110 Vomiting, unspecified: Secondary | ICD-10-CM | POA: Diagnosis not present

## 2014-05-30 LAB — CBC WITH DIFFERENTIAL/PLATELET
Basophils Absolute: 0 10*3/uL (ref 0.0–0.1)
Basophils Relative: 0 % (ref 0–1)
EOS PCT: 1 % (ref 0–5)
Eosinophils Absolute: 0.1 10*3/uL (ref 0.0–0.7)
HCT: 38.5 % (ref 36.0–46.0)
Hemoglobin: 12.7 g/dL (ref 12.0–15.0)
LYMPHS ABS: 3.2 10*3/uL (ref 0.7–4.0)
LYMPHS PCT: 42 % (ref 12–46)
MCH: 29.8 pg (ref 26.0–34.0)
MCHC: 33 g/dL (ref 30.0–36.0)
MCV: 90.4 fL (ref 78.0–100.0)
Monocytes Absolute: 0.4 10*3/uL (ref 0.1–1.0)
Monocytes Relative: 5 % (ref 3–12)
Neutro Abs: 4 10*3/uL (ref 1.7–7.7)
Neutrophils Relative %: 52 % (ref 43–77)
PLATELETS: 365 10*3/uL (ref 150–400)
RBC: 4.26 MIL/uL (ref 3.87–5.11)
RDW: 13.3 % (ref 11.5–15.5)
WBC: 7.7 10*3/uL (ref 4.0–10.5)

## 2014-05-30 LAB — COMPREHENSIVE METABOLIC PANEL
ALK PHOS: 77 U/L (ref 39–117)
ALT: 14 U/L (ref 0–35)
AST: 16 U/L (ref 0–37)
Albumin: 3.8 g/dL (ref 3.5–5.2)
Anion gap: 15 (ref 5–15)
BUN: 10 mg/dL (ref 6–23)
CALCIUM: 9.1 mg/dL (ref 8.4–10.5)
CHLORIDE: 107 meq/L (ref 96–112)
CO2: 20 meq/L (ref 19–32)
Creatinine, Ser: 0.84 mg/dL (ref 0.50–1.10)
GFR calc Af Amer: >90 mL/min (ref >90)
GLUCOSE: 104 mg/dL — AB (ref 70–99)
POTASSIUM: 4.5 meq/L (ref 3.7–5.3)
SODIUM: 142 meq/L (ref 137–147)
Total Bilirubin: 0.3 mg/dL (ref 0.3–1.2)
Total Protein: 7.5 g/dL (ref 6.0–8.3)

## 2014-05-30 MED ORDER — BENZONATATE 100 MG PO CAPS: 100.0000 mg | ORAL_CAPSULE | Freq: Three times a day (TID) | ORAL | Status: AC

## 2014-05-30 MED ORDER — PROMETHAZINE HCL 25 MG/ML IJ SOLN
50.0000 mg | Freq: Once | INTRAMUSCULAR | Status: AC
  Administered 2014-05-30: 50 mg via INTRAMUSCULAR
  Filled 2014-05-30: qty 2

## 2014-05-30 MED ORDER — METOCLOPRAMIDE HCL 5 MG/ML IJ SOLN
10.0000 mg | Freq: Once | INTRAMUSCULAR | Status: AC
  Administered 2014-05-30: 10 mg via INTRAVENOUS
  Filled 2014-05-30: qty 2

## 2014-05-30 MED ORDER — HYDROMORPHONE HCL 1 MG/ML IJ SOLN
1.0000 mg | Freq: Once | INTRAMUSCULAR | Status: DC
  Filled 2014-05-30: qty 1

## 2014-05-30 MED ORDER — HYDROMORPHONE HCL 1 MG/ML IJ SOLN
1.0000 mg | INTRAMUSCULAR | Status: DC | PRN
  Administered 2014-05-30 (×2): 1 mg via INTRAMUSCULAR
  Filled 2014-05-30: qty 1

## 2014-05-30 MED ORDER — KETOROLAC TROMETHAMINE 30 MG/ML IJ SOLN
30.0000 mg | Freq: Once | INTRAMUSCULAR | Status: AC
  Administered 2014-05-30: 30 mg via INTRAVENOUS
  Filled 2014-05-30: qty 1

## 2014-05-30 MED ORDER — HYDROMORPHONE HCL 2 MG/ML IJ SOLN: 2.0000 mg | Freq: Once | INTRAMUSCULAR | Status: DC

## 2014-05-30 MED ORDER — PROMETHAZINE HCL 25 MG PO TABS: 25.0000 mg | ORAL_TABLET | Freq: Four times a day (QID) | ORAL | Status: AC | PRN

## 2014-05-30 MED ORDER — SODIUM CHLORIDE 0.9 % IV BOLUS (SEPSIS)
1000.0000 mL | Freq: Once | INTRAVENOUS | Status: AC
  Administered 2014-05-30: 1000 mL via INTRAVENOUS

## 2014-05-30 NOTE — ED Notes (Signed)
Pt does not have to urinate at this time.  

## 2014-05-30 NOTE — ED Provider Notes (Signed)
CSN: [id number]     Arrival date & time 05/30/14  1843 History   First MD Initiated Contact with Patient 05/30/14 2157     Chief Complaint  Patient presents with  . Emesis  . Migraine  . Pneumonia      HPI  Vision presents with multiple complaints. [facility] or last 3 weeks. Had a chest x-ray with her initial diagnosis 3 weeks ago. Continues take anabolic processes nauseating to her peers been vomiting for last 2 days. Patient has a "migraine headache" continues with a cough. Not short of breath. Clear sputum.  Past Medical History  Diagnosis Date  . Migraines   . Bipolar 1 disorder    Past Surgical History  Procedure Laterality Date  . Ankle surgery     No family history on file. History  Substance Use Topics  . Smoking status: Current Some Day Smoker  . Smokeless tobacco: Not on file  . Alcohol Use: Yes     Comment: social   OB History    No data available     Review of Systems  Constitutional: Negative for fever, chills, diaphoresis, appetite change and fatigue.  HENT: Negative for mouth sores, sore throat and trouble swallowing.   Eyes: Negative for visual disturbance.  Respiratory: Positive for shortness of breath. Negative for cough, chest tightness and wheezing.   Cardiovascular: Negative for chest pain.  Gastrointestinal: Negative for nausea, vomiting, abdominal pain, diarrhea and abdominal distention.  Endocrine: Negative for polydipsia, polyphagia and polyuria.  Genitourinary: Negative for dysuria, frequency and hematuria.  Musculoskeletal: Negative for gait problem.  Skin: Negative for color change, pallor and rash.  Neurological: Positive for headaches. Negative for dizziness, syncope and light-headedness.  Hematological: Does not bruise/bleed easily.  Psychiatric/Behavioral: Negative for behavioral problems and confusion.      Allergies  Benadryl; Erythromycin; Lamictal; Saphris; and  Seroquel  Home Medications   Prior to Admission medications   Medication Sig Start Date End Date Taking? Authorizing Provider  acetaminophen (TYLENOL) 500 MG tablet Take 1,500 mg by mouth every 6 (six) hours as needed for moderate pain.   Yes Historical Provider, MD  albuterol (PROVENTIL HFA;VENTOLIN HFA) 108 (90 BASE) MCG/ACT inhaler Inhale 2 puffs into the lungs every 6 (six) hours as needed for wheezing or shortness of breath.   Yes Historical Provider, MD  clonazePAM (KLONOPIN) 2 MG tablet Take 2 mg by mouth 3 (three) times daily as needed for anxiety. scheduled   Yes Historical Provider, MD  doxepin (SINEQUAN) 10 MG/ML solution Take 150 mg by mouth at bedtime as needed for sleep.  07/24/13  Yes Historical Provider, MD  FLUoxetine (PROZAC) 20 MG capsule Take 80 mg by mouth daily.    Yes Historical Provider, MD  LEVONEST tablet Take 1 tablet by mouth daily.  08/12/13  Yes Historical Provider, MD  lithium carbonate (ESKALITH) 450 MG CR tablet Take 900 mg by mouth 2 (two) times daily.   Yes Historical Provider, MD  naproxen sodium (ANAPROX) 220 MG tablet Take 660 mg by mouth 2 (two) times daily as needed (pain).   Yes Historical Provider, MD  ondansetron (ZOFRAN-ODT) 8 MG disintegrating tablet Take 16 mg by mouth every 8 (eight) hours as needed for nausea.   Yes Historical Provider, MD  oxyCODONE-acetaminophen (PERCOCET) 10-325 MG per tablet Take 2 tablets by mouth every 4 (four) hours as needed for pain.   Yes Historical Provider, MD  propranolol (INDERAL) 20 MG tablet Take  20 mg by mouth daily as needed. Anxiety   Yes Historical Provider, MD  zolpidem (AMBIEN) 10 MG tablet Take 10 mg by mouth at bedtime as needed for sleep.  02/27/14  Yes Historical Provider, MD  benzonatate (TESSALON) 100 MG capsule Take 1 capsule (100 mg total) by mouth every 8 (eight) hours. 05/30/14   Rolland PorterMark Madigan Rosensteel, MD  promethazine (PHENERGAN) 25 MG tablet Take 1 tablet (25 mg total) by mouth every 6 (six) hours as needed for  nausea or vomiting. 05/30/14   Rolland PorterMark Jhene Westmoreland, MD   BP 133/68 mmHg  Pulse 67  Temp(Src) 98.2 F (36.8 C) (Oral)  Resp 16  Ht 5\' 4"  (1.626 m)  Wt 185 lb (83.915 kg)  BMI 31.74 kg/m2  SpO2 100%  LMP 05/13/2014 Physical Exam  Constitutional: She is oriented to person, place, and time. She appears well-developed and well-nourished. No distress.  HENT:  Head: Normocephalic.  Eyes: Conjunctivae are normal. Pupils are equal, round, and reactive to light. No scleral icterus.  Neck: Normal range of motion. Neck supple. No thyromegaly present.  Cardiovascular: Normal rate and regular rhythm.  Exam reveals no gallop and no friction rub.   No murmur heard. Pulmonary/Chest: Effort normal and breath sounds normal. No respiratory distress. She has no wheezes. She has no rales.  Abdominal: Soft. Bowel sounds are normal. She exhibits no distension. There is no tenderness. There is no rebound.  Musculoskeletal: Normal range of motion.  Neurological: She is alert and oriented to person, place, and time.  Skin: Skin is warm and dry. No rash noted.  Psychiatric: She has a normal mood and affect. Her behavior is normal.    ED Course  Procedures (including critical care time) Labs Review Labs Reviewed  COMPREHENSIVE METABOLIC PANEL - Abnormal; Notable for the following:    Glucose, Bld 104 (*)    All other components within normal limits  CBC WITH DIFFERENTIAL    Imaging Review Dg Chest 2 View  05/30/2014   CLINICAL DATA:  Cough several weeks.  EXAM: CHEST  2 VIEW  COMPARISON:  None.  FINDINGS: Lungs are clear. Cardiomediastinal silhouette is within normal. Bones and soft tissues are unremarkable.  IMPRESSION: No active cardiopulmonary disease.   Electronically Signed   By: Elberta Fortisaniel  Boyle M.D.   On: 05/30/2014 22:36     EKG Interpretation None      MDM   Final diagnoses:  Cough  Other migraine without status migrainosus, not intractable    Chest x-ray shows no pneumonia. Not hypoxic or  febrile. Given IV fluids. Given IM Dilaudid and IM Phenergan. Plan be discharged home with Phenergan and also present. Cipro antibiotic as she has no sign of pneumonia currently.    Rolland PorterMark Latonia Conrow, MD 05/30/14 [phone]

## 2014-05-30 NOTE — ED Notes (Signed)
Pt states that she was diagnosed with Pneumonia approx 3 weeks ago; pt states that she still feels short of breath, productive cough of green sputum and just isn;t feeling any better; pt states that she is in her 3rd round of antibiotics; pt is currently on Levaquin and has taken a Z-Pack and Augmentin in the last 3 weeks; pt c/o N/V and states "I can't keep anything down"; pt reports 20 episodes of vomiting today; pt c/o diarrhea as well; pt states that she has chronic migraines and the coughing and vomiting has caused a "severe" migraine

## 2014-05-30 NOTE — Discharge Instructions (Signed)
Stop your antibiotic. Phenergan for nausea.  Migraine Headache A migraine headache is an intense, throbbing pain on one or both sides of your head. A migraine can last for 30 minutes to several hours. CAUSES  The exact cause of a migraine headache is not always known. However, a migraine may be caused when nerves in the brain become irritated and release chemicals that cause inflammation. This causes pain. Certain things may also trigger migraines, such as:  Alcohol.  Smoking.  Stress.  Menstruation.  Aged cheeses.  Foods or drinks that contain nitrates, glutamate, aspartame, or tyramine.  Lack of sleep.  Chocolate.  Caffeine.  Hunger.  Physical exertion.  Fatigue.  Medicines used to treat chest pain (nitroglycerine), birth control pills, estrogen, and some blood pressure medicines. SIGNS AND SYMPTOMS  Pain on one or both sides of your head.  Pulsating or throbbing pain.  Severe pain that prevents daily activities.  Pain that is aggravated by any physical activity.  Nausea, vomiting, or both.  Dizziness.  Pain with exposure to bright lights, loud noises, or activity.  General sensitivity to bright lights, loud noises, or smells. Before you get a migraine, you may get warning signs that a migraine is coming (aura). An aura may include:  Seeing flashing lights.  Seeing bright spots, halos, or zigzag lines.  Having tunnel vision or blurred vision.  Having feelings of numbness or tingling.  Having trouble talking.  Having muscle weakness. DIAGNOSIS  A migraine headache is often diagnosed based on:  Symptoms.  Physical exam.  A CT scan or MRI of your head. These imaging tests cannot diagnose migraines, but they can help rule out other causes of headaches. TREATMENT Medicines may be given for pain and nausea. Medicines can also be given to help prevent recurrent migraines.  HOME CARE INSTRUCTIONS  Only take over-the-counter or prescription  medicines for pain or discomfort as directed by your health care provider. The use of long-term narcotics is not recommended.  Lie down in a dark, quiet room when you have a migraine.  Keep a journal to find out what may trigger your migraine headaches. For example, write down:  What you eat and drink.  How much sleep you get.  Any change to your diet or medicines.  Limit alcohol consumption.  Quit smoking if you smoke.  Get 7-9 hours of sleep, or as recommended by your health care provider.  Limit stress.  Keep lights dim if bright lights bother you and make your migraines worse. SEEK IMMEDIATE MEDICAL CARE IF:   Your migraine becomes severe.  You have a fever.  You have a stiff neck.  You have vision loss.  You have muscular weakness or loss of muscle control.  You start losing your balance or have trouble walking.  You feel faint or pass out.  You have severe symptoms that are different from your first symptoms. MAKE SURE YOU:   Understand these instructions.  Will watch your condition.  Will get help right away if you are not doing well or get worse. Document Released: 07/05/2005 Document Revised: 11/19/2013 Document Reviewed: 03/12/2013 [facility] Patient Information 2015 [employer] [place]. This information is not intended to replace advice given to you by your health care provider. Make sure you discuss any questions you have with your health care provider.

## 2014-05-30 NOTE — ED Notes (Signed)
MD at bedside. 

## 2014-06-25 ENCOUNTER — Encounter (HOSPITAL_COMMUNITY): Payer: Self-pay | Admitting: Emergency Medicine

## 2014-06-25 ENCOUNTER — Emergency Department (HOSPITAL_COMMUNITY)
Admission: EM | Admit: 2014-06-25 | Discharge: 2014-06-25 | Disposition: A | Discharge status: Home / Self Care | Payer: Medicare Other | Attending: Emergency Medicine | Admitting: Emergency Medicine

## 2014-06-25 DIAGNOSIS — Z720 Tobacco use: Secondary | ICD-10-CM | POA: Diagnosis not present

## 2014-06-25 DIAGNOSIS — G43909 Migraine, unspecified, not intractable, without status migrainosus: Secondary | ICD-10-CM | POA: Diagnosis present

## 2014-06-25 DIAGNOSIS — G43009 Migraine without aura, not intractable, without status migrainosus: Secondary | ICD-10-CM | POA: Diagnosis not present

## 2014-06-25 DIAGNOSIS — F319 Bipolar disorder, unspecified: Secondary | ICD-10-CM | POA: Insufficient documentation

## 2014-06-25 DIAGNOSIS — Z79899 Other long term (current) drug therapy: Secondary | ICD-10-CM | POA: Insufficient documentation

## 2014-06-25 MED ORDER — KETOROLAC TROMETHAMINE 60 MG/2ML IM SOLN
60.0000 mg | Freq: Once | INTRAMUSCULAR | Status: AC
  Administered 2014-06-25: 60 mg via INTRAMUSCULAR
  Filled 2014-06-25: qty 2

## 2014-06-25 MED ORDER — PROMETHAZINE HCL 25 MG/ML IJ SOLN
25.0000 mg | Freq: Once | INTRAMUSCULAR | Status: AC
  Administered 2014-06-25: 25 mg via INTRAVENOUS
  Filled 2014-06-25: qty 1

## 2014-06-25 NOTE — ED Notes (Signed)
Bed: WA13 Expected date:  Expected time:  Means of arrival:  Comments: EMS syncope 

## 2014-06-25 NOTE — ED Notes (Signed)
Pt c/o migraine that has been constant since Friday.  Pt said ECT causes them and she had one of Friday.  Pt has blurred visios, n/v.  Pt requesting IM meds bc work better and longer than IV medications.

## 2014-06-25 NOTE — Discharge Instructions (Signed)
Return to the ED with any concerns including vomiting and not able to keep down liquids, weakness of arms or legs, changes in vision or speech, fainting, decreased level of alertness/lethargy, or any other alarming symptoms °

## 2014-06-25 NOTE — ED Provider Notes (Signed)
CSN: [id number]     Arrival date & time 06/25/14  1016 History   First MD Initiated Contact with Patient 06/25/14 1047     Chief Complaint  Patient presents with  . Migraine     (Consider location/radiation/quality/duration/timing/severity/associated sxs/prior Treatment) HPI  Pt presents with c/o migraine headache.  She states she has hx of migraines and this is similar to her prior migraines.  ECT therapy was the trigger and headache started 4 days ago- she has had similar trigger by ECT in the past.  No fever.  She states she has had nausea and vomiting and has not been able to take her po meds at home.  Pt has no focal weakness or numbness.  No changes in vision or speech.  Pt requests phenergan and dilaudid IM- [place].  She has not followed up, does not have current PMD, neurologist or pain management doctor.  There are no other associated systemic symptoms, there are no other alleviating or modifying factors.   Past Medical History  Diagnosis Date  . Migraines   . Bipolar 1 disorder    Past Surgical History  Procedure Laterality Date  . Ankle surgery     No family history on file. History  Substance Use Topics  . Smoking status: Current Some Day Smoker    Types: Cigarettes  . Smokeless tobacco: Not on file  . Alcohol Use: Yes     Comment: social   OB History    No data available     Review of Systems  ROS reviewed and all otherwise negative except for mentioned in HPI    Allergies  Benadryl; Erythromycin; Lamictal; Saphris; and Seroquel  Home Medications   Prior to Admission medications   Medication Sig Start Date End Date Taking? Authorizing Provider  acetaminophen (TYLENOL) 500 MG tablet Take 1,500 mg by mouth every 6 (six) hours as needed for moderate pain.   Yes Historical Provider, MD  albuterol (PROVENTIL HFA;VENTOLIN HFA) 108 (90 BASE) MCG/ACT inhaler Inhale 2 puffs into the lungs every 6  (six) hours as needed for wheezing or shortness of breath.   Yes Historical Provider, MD  clonazePAM (KLONOPIN) 2 MG tablet Take 2 mg by mouth 3 (three) times daily as needed for anxiety. scheduled   Yes Historical Provider, MD  doxepin (SINEQUAN) 10 MG/ML solution Take 150 mg by mouth at bedtime as needed for sleep.  07/24/13  Yes Historical Provider, MD  FLUoxetine (PROZAC) 20 MG capsule Take 80 mg by mouth daily.    Yes Historical Provider, MD  LEVONEST tablet Take 1 tablet by mouth daily.  08/12/13  Yes Historical Provider, MD  lithium carbonate (ESKALITH) 450 MG CR tablet Take 900 mg by mouth 2 (two) times daily.   Yes Historical Provider, MD  naproxen sodium (ANAPROX) 220 MG tablet Take 660 mg by mouth 2 (two) times daily as needed (pain).   Yes Historical Provider, MD  ondansetron (ZOFRAN-ODT) 8 MG disintegrating tablet Take 16 mg by mouth every 8 (eight) hours as needed for nausea.   Yes Historical Provider, MD  oxyCODONE-acetaminophen (PERCOCET) 10-325 MG per tablet Take 2 tablets by mouth every 4 (four) hours as needed for pain.   Yes Historical Provider, MD  promethazine (PHENERGAN) 25 MG tablet Take 1 tablet (25 mg total) by mouth every 6 (six) hours as needed for nausea or vomiting. 05/30/14  Yes Rolland PorterMark James, MD  propranolol (INDERAL) 20 MG tablet Take 20  mg by mouth daily as needed. Anxiety   Yes Historical Provider, MD  zolpidem (AMBIEN) 10 MG tablet Take 10 mg by mouth at bedtime as needed for sleep.  02/27/14  Yes Historical Provider, MD  benzonatate (TESSALON) 100 MG capsule Take 1 capsule (100 mg total) by mouth every 8 (eight) hours. Patient not taking: Reported on 06/25/2014 05/30/14   Rolland PorterMark James, MD   BP 128/75 mmHg  Pulse 61  Temp(Src) 98.4 F (36.9 C) (Oral)  Resp 16  SpO2 100%  LMP 05/01/2014  Vitals reviewed Physical Exam  Physical Examination: General appearance - alert, well appearing, and in no distress Mental status - alert, oriented to person, place, and time Eyes -  pupils equal and reactive, extraocular eye movements intact Neck - supple, no significant adenopathy Chest - clear to auscultation, no wheezes, rales or rhonchi, symmetric air entry Heart - normal rate, regular rhythm, normal S1, S2, no murmurs, rubs, clicks or gallops Neurological - alert, oriented x 3, cranial nerves 2-12 tested and intact, strength 5/5 in extremities x 4, sensation intact Extremities - peripheral pulses normal, no pedal edema, no clubbing or cyanosis Skin - normal coloration and turgor, no rashes  ED Course  Procedures (including critical care time)  12:07 PM pt states she is ready for discharge Labs Review Labs Reviewed - No data to display  Imaging Review No results found.   EKG Interpretation None      MDM   Final diagnoses:  Migraine without aura and without status migrainosus, not intractable    Pt presenting with c/o migraine similar to her prior migraine headaches, normal neuro exam.  No red flags for [facility] or other acute intracranial emergency.    Pt requesting dilaudid for migraine headache.  I have discussed with her that dilaudid is not indicated for treatment of migraine and that I will not be ordering dilaudid.  She is willing to try toradol and phenergan.  States nothing else works for her.  Per prior chart review, she has received dilaudid in the ED here, however note from 10/14 states she was told she would not be given narcotics for migraine in our ED system.     Ethelda ChickMartha K Linker, MD 06/25/14 [phone]

## 2016-01-10 IMAGING — CR DG CHEST 2V
2 series · 2 of 2 positions shown · non-contrast
Comparison: None.

CLINICAL DATA: Cough several weeks.

EXAM:
CHEST  2 VIEW

[w chest pa]
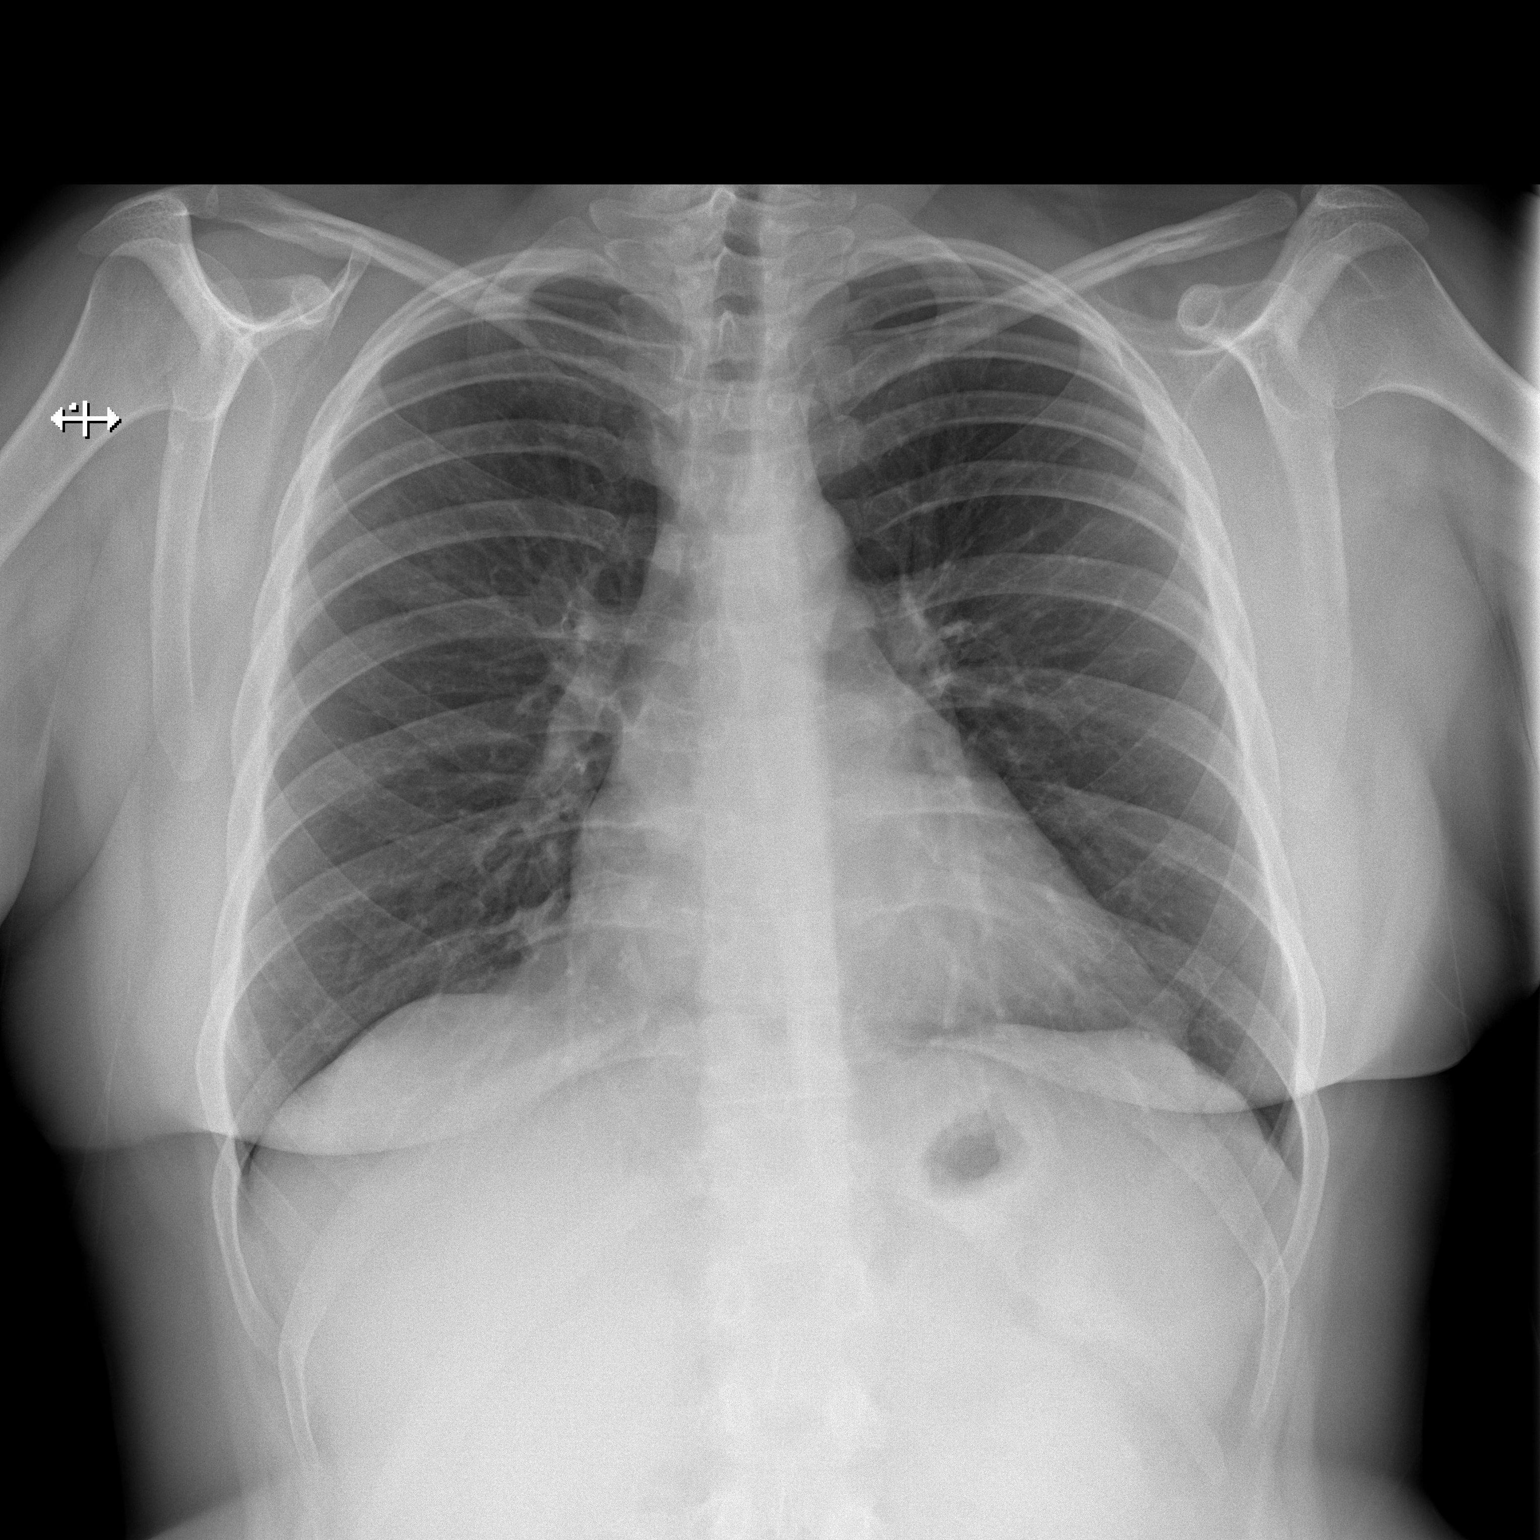

[w chest lat]
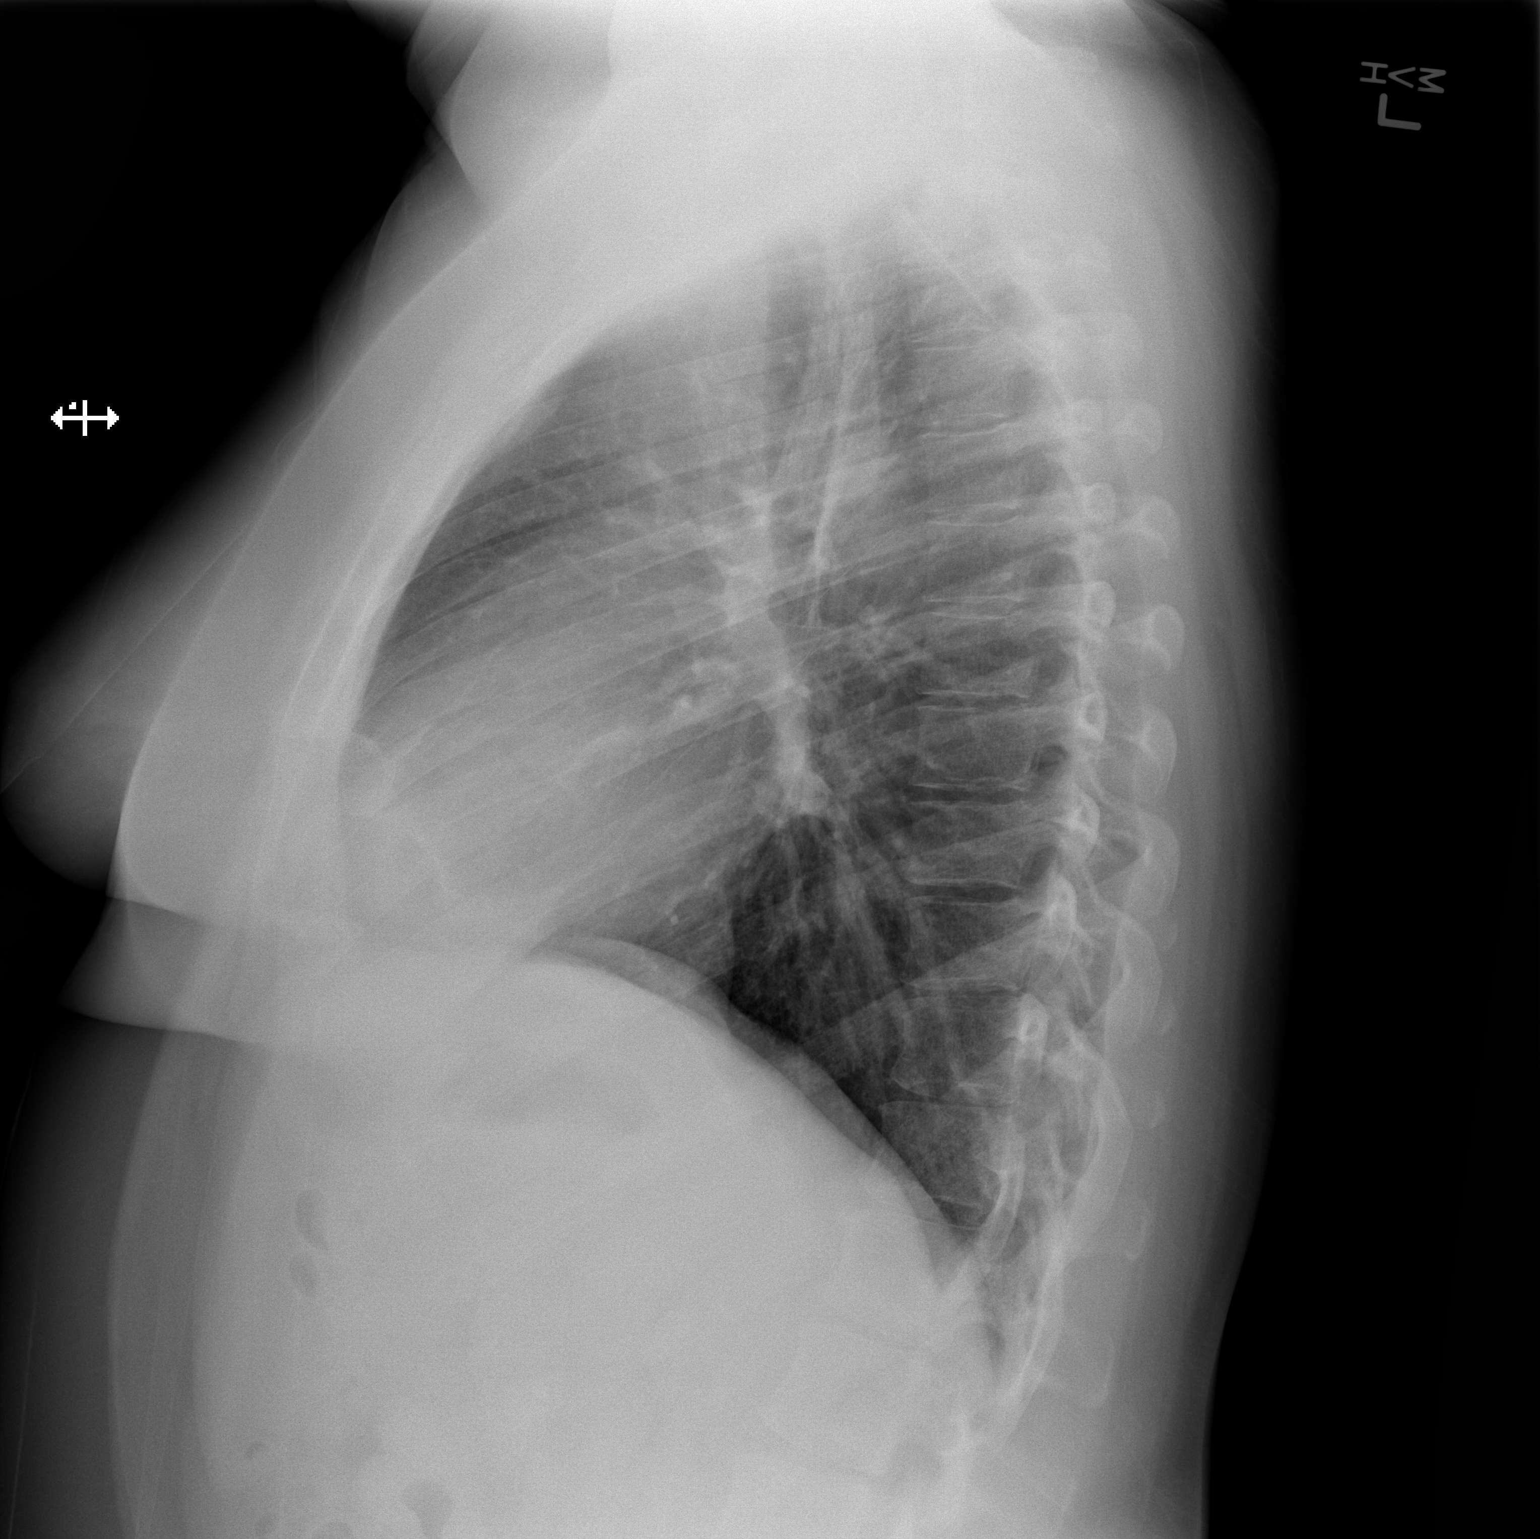

[2 of 2 positions shown; findings below may reference images not displayed]

FINDINGS: Lungs are clear. Cardiomediastinal silhouette is within normal.
Bones and soft tissues are unremarkable.
IMPRESSION: No active cardiopulmonary disease.

## 2020-11-03 LAB — COVID-19 (OUTSIDE ORGANIZATION): COVID-19 Results (Outside Organization): NOT DETECTED

## 2021-02-24 ENCOUNTER — Ambulatory Visit: Admit: 2021-02-24 | Discharge: 2021-02-24 | Disposition: A | Discharge status: Home / Self Care | Payer: Self-pay

## 2021-05-21 ENCOUNTER — Ambulatory Visit: Admit: 2021-05-21 | Discharge: 2021-05-21 | Disposition: A | Discharge status: Home / Self Care | Payer: Self-pay

## 2021-05-21 LAB — COVID-19 (OUTSIDE ORGANIZATION): COVID-19 Results (Outside Organization): NOT DETECTED

## 2021-06-16 ENCOUNTER — Encounter: Payer: Self-pay | Admitting: Otolaryngology

## 2021-06-16 DIAGNOSIS — J3489 Other specified disorders of nose and nasal sinuses: Secondary | ICD-10-CM

## 2021-09-11 ENCOUNTER — Other Ambulatory Visit: Payer: Self-pay | Admitting: Otolaryngology

## 2021-09-11 ENCOUNTER — Encounter: Payer: Self-pay | Admitting: Otolaryngology

## 2021-09-11 NOTE — Progress Notes (Signed)
Received fax from [facility] stating images for CT Sinus 05/21/2021 and 02/24/2021 was pushed. Images viewed in Outside Images and archive submitted.      Thank you,     Tajai Ihde Archivist / ENT Support  Dept of Otolaryngology/ Dental Clinic   Ph: [phone]   Fax: [phone]

## 2021-09-22 ENCOUNTER — Ambulatory Visit: Payer: Commercial Managed Care - PPO | Admitting: Otolaryngology

## 2021-11-30 NOTE — Progress Notes (Signed)
 PATIENT:  Tracey Stewart, Tracey Stewart   MR #:  [id number]   DOB:  10-27-1980  SEX:  female   AGE: 32yr   SERVICE DATE:  12/01/2021     OTOLARYNGOLOGY CLINIC NOTE     REFERRING PHYSICIAN: [facility], Beverley Faden, DO    CC: Septal perforation      HISTORY OF PRESENT ILLNESS:  Tracey Stewart is a 41yr old female with history of anxiety, bipolar disorder, and h/o intranasal cocaine use, who presents to [facility] with septal perforation.    Background:  Per medical records, patient has been followed by Dr. 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 Patient on Bactroban/surfactant irrigations twice a day. Has been on multiple courses of antibiotics. Patient was referred to our clinic to review potential treatment options.     Lupus panel 07/29/21:  ANA Scrn, IFA Neg Neg    Ribosomal P Antibody <1.0 U <0.2    dsDNA Ab <5 IU/mL <1    C3 Complement Comp 84 - 160 mg/dL 782 High     C4 Complement Comp 12 - 36 mg/dL 49 High     RA,Quant 0 - 15 IU/mL <5    Scl-70 Antibody <1.0 U <0.2    Thyroid Perox AutoAb <60 IU/mL <50    Smith (Sm) Antibody <1.0 U <0.2    SmRNP Antibody <1.0 U <0.2    SSA Antibody <1.0 U <0.2    SSB Antibody <1.0 U <0.2      CRP: 62.7 (05/20/21) --> 16.6 (05/23/21)  ESR: 99 (05/20/21)    CT 05/21/21:      Subjective:  Patient states having persistent, progressive nasal pain, facial pressure, nasal drainage and nasal crusting for the last 6 months to a year. Has associated hyposmia, hypogeusia, and a change in the appearance of her nose. Describes her nose as larger appearing with change in the appearance of her nasal tip. Denies change in her palate or the roof of her mouth. Further states the symptoms were initially mostly left sided, but has progressed and now has symptoms area also on the right side.   Patient uses humidification and has used nasal saline irrigations. Denies having history of recurrent sinus infections requiring antibiotics in the past, recurrent PNAs,  or lung disease. Reports history of recurrent kidney infections. No personal or known family history of rheumatologic disease. She has not seen a rheumatologist in the past.     Patient reports using intranasal cocaine regularly 10 years ago, and occasionally in the last few years. Last intranasal cocaine use was ~1 year ago. Denies history of nasal/sinus surgery, but has had nasal trauma in the past.     SNOT-22 test: 70.5 with need to blow nose, nasal blockage, thick nasal discharge, facial pain/pressure, and decreased sense of smell/taste as the most important items affecting Her health.     Past Medical History:   Diagnosis Date    Anxiety     Bipolar disorder (HCC)     Migraine     Nasal septal perforation       Past Surgical History:   Procedure Laterality Date    NO SURGICAL HISTORY        Current Outpatient Medications   Medication Sig Dispense Refill    Budesonide  (PULMICORT ) 0.5 mg/2 mL Suspension for Nebulization Add 2ml (1 vial) of the budesonide  into 240ml of saline irrigation and irrigate your sinuses twice daily 120 each 6    sesame oil Oil 1-2  sprays in each nostril BID PRN 120 mL 11     No current facility-administered medications for this visit.      Allergies:  Not on File   Family History   Problem Relation Name Age of Onset    Melanoma Mother      Migraine Mother      Prostate Cancer Father      Hypertension Father      Brain Cancer Paternal Grandfather      Lung Cancer Paternal Grandfather        Social History     Socioeconomic History    Marital status: SINGLE   Tobacco Use    Smoking status: Former     Types: Cigarettes    Smokeless tobacco: Never   Substance and Sexual Activity    Alcohol use: Yes    Drug use: Not Currently     Types: Cocaine     Comment: H/o intranasal cocaine use. last used in 2022, but recent use of cocaine in April 2023, just not intranasally       REVIEW OF SYSTEMS  All systems were reviewed and were negative except for those mentioned in the HPI.        PHYSICAL  EXAMINATION:    There were no vitals taken for this visit.     GENERAL:  The patient is a well-nourished, well-developed female sitting comfortably in no acute distress.     SKIN:  The skin is warm and dry.      MENTAL STATUS:  Alert and oriented to person, place and time.     HEENT:     Head:  Normocephalic, atraumatic.     Eyes:  Extraocular movements intact. Conjunctiva and sclera were clear.       Ears:   Right external auditory canal is clear, tympanic membrane intact and without evidence of middle ear effusion or infection.     Left external auditory canal is clear, tympanic membrane intact and without evidence of middle ear effusion or infection.        Nose: Anterior rhinoscopy: reveals moist mucous membranes. Tenderness and fullness over left nasal bones. Right nasal ala is swollen without tenderness. Nasal endoscopy was indicated to better evaluate remainder of the nasal cavity and paranasal sinuses given the patient's history and exam findings and is detailed below.    Oral Cavity/oropharynx:  The buccal mucosa, lips, gingiva, retromolar trigone, alveolar ridge, floor of mouth, tongue and palate are normal and without evidence of lesions or ulcerations. Palate feels intact.      Neck:  Supple.  Endocrine:  No thyromegaly.  Lymphatics:  No cervical lymphadenopathy.  Respiratory:  The patient is breathing comfortably in no acute distress.  Neurologic exam:  Cranial nerves II-XII are grossly intact.     I personally reviewed the CT imaging (05/21/2021) and used the findings in our medical decision making: Significant intranasal inflammation with large septal perforation.    PROCEDURE: Diagnostic Nasal Endoscopy with Debridement (68762), bilateral  Anesthesia: Lidocaine 4% with Phenylephrine topical anesthetic was placed.  Description of Procedure: After obtaining verbal consent, a pre-procedure pause was performed prior to application of topical lidocaine and phenylephrine. Rigid endoscopy was performed to  evaluate the sinonasal cavities, mucosa, sinus ostia and turbinates.   -Right:Inferior turbinate and middle turbinates are not visible, significant crusting that was debrided.   -Left:Inferior turbinate is not visible, middle turbinate is visible, significant crusting that was debrided, synechia noted in posterior nasal cavity ,   -Septum:Near  total septal perforation,       The patient tolerated the procedure without complications.    IMAGING    Lund-Mackay CT Scoring System (0-24) Right/Left  Frontal Right/Left (0 = clear; 1 = partial opacification; 2 = total opacification) 0/0   Anterior Ethmoids Right/Left (0 = clear; 1 = partial opacification; 2 = total opacification) 0/0   Posterior Ethmoids Right/Left (0 = clear; 1 = partial opacification; 2 = total opacification)0/0   Sphenoid Sinus (0 = clear; 1 = partial opacification; 2 = total opacification) 0/0   Maxillary Sinus (0 = clear; 1 = partial opacification; 2 = total opacification) 0/1   Osteomeatal Complex (0 = clear; 1 = partial opacification; 2 = total opacification) 0/0     ASSESSMENT/PLAN:  1. Septal perforation with nasal crusting:  - Patient with near total septal perforation on endoscopic exam with significant nasal crusting that was debrided.  - Patient has not had regular intranasal cocaine use in many years, without any intranasal cocaine use in more than 1 year.   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   - Recommend referral to rheumatology for evaluation of possible GPA and recommend further immune workup including: Myeloperoxidase, PR3, rheumatoid factor, and UA. Called patient's PCP Lynwood Johnie Ada, MD who will place urgent referral to in-network rheumatologist to be seen within 7-10 days.   - Rx given for Budesonide  nasal irrigations 0.5 mg/2mL BID and Sesame rose geranium oil  emollient.      I spent a total of 60 minutes (excluding time spent on other billable services) which includes face-to-face time and non-face-to-face time spent on preparing to see the patient, performing a medically appropriate exam, counseling and educating the patient, ordering medications/tests/procedures/referrals as clinically indicated, and documenting information in the electronic medical record.       FOLLOW UP: PRN.        Barriers to Learning assessed: none. Patient verbalizes understanding of teaching and instructions.    SCRIBE DISCLAIMER:  I, Livingston Paganini, SCRIBE,  am personally taking down the notes in the presence of Dr. Dorthula.  Electronically signed by GLENWOOD Livingston Paganini, SCRIBE,  12/01/2021  1:58 PM.  PROVIDER DISCLAIMER:  This document serves as my personal record of services taken in my  presence. It was created on 12/01/2021 on my behalf by the scribe mentioned above, a trained medical scribe, and it's both accurate and complete.    12/01/2021  Signed,  FORBES Adine Dorthula, MD  Professor   Dept of Otolaryngology  [place] [facility]  [address]  [place], [place] [address]   T: [phone]  F: [phone]

## 2021-12-01 ENCOUNTER — Ambulatory Visit: Payer: No Typology Code available for payment source | Attending: Otolaryngology | Admitting: Otolaryngology

## 2021-12-01 ENCOUNTER — Encounter: Payer: Self-pay | Admitting: Otolaryngology

## 2021-12-01 DIAGNOSIS — J3489 Other specified disorders of nose and nasal sinuses: Secondary | ICD-10-CM | POA: Insufficient documentation

## 2021-12-01 MED ORDER — SESAME OIL
TOPICAL_OIL | 11 refills | Status: AC
Start: 2021-12-01 — End: 2022-12-02

## 2021-12-01 MED ORDER — BUDESONIDE 0.5 MG/2 ML SUSPENSION FOR NEBULIZATION
INHALATION_SUSPENSION | RESPIRATORY_TRACT | 6 refills | Status: DC
Start: 2021-12-01 — End: 2021-12-24

## 2021-12-09 ENCOUNTER — Telehealth: Payer: Self-pay | Admitting: Otology & Neurotology

## 2021-12-09 NOTE — Telephone Encounter (Addendum)
Verify 3x    General Advice / Message to MD:    Patient would like a message sent to Dr Barrington Kell, she stated she had surgeon friends at [facility] who recommended to fu w/ dr Laney Potash but that office is not scheduling until 12/23.  Patient stated she is doing the best she can by following all instructions of doctor and would like to note her appreciation to Dr Barrington [place].    Barbara Cower   PSR II- [facility]  Otolaryngology

## 2021-12-10 NOTE — Telephone Encounter (Signed)
Called patient and LVM to return call to [phone].    Marianna Fuss, RN, BSN, [facility]  ENT Triage  [employer]  [phone]

## 2021-12-17 ENCOUNTER — Telehealth: Payer: Self-pay | Admitting: Otolaryngology

## 2021-12-17 NOTE — Telephone Encounter (Signed)
Per [facility], Budesonide  susp is on backorder.      Forwarding to doctor as Juluis Rainier and/or to advise if patient to use anything else. - Narda Amber, [facility] Triage

## 2021-12-22 ENCOUNTER — Other Ambulatory Visit: Payer: Self-pay | Admitting: Otolaryngology

## 2021-12-22 DIAGNOSIS — J3489 Other specified disorders of nose and nasal sinuses: Secondary | ICD-10-CM

## 2021-12-22 NOTE — Telephone Encounter (Signed)
Called other pharmacies to verify any Budesonide susp on stock.  Pt okay with transferring Rx to [facility] at [address], [place] [phone] and [phone] F#. - Kamali Sakata Hedges, MA II [facility] E[facility] Triage

## 2022-02-08 ENCOUNTER — Telehealth: Payer: Self-pay | Admitting: Otolaryngology

## 2022-02-08 NOTE — Telephone Encounter (Signed)
General Advice / Message to MD:    Pt is calling and needing more advise. Pt has not been able to see a Rheumatology Dr per Dr Hayden Rasmussen request since she was last seen. Pt is requesting a call back for some help and guidances.   Pt was advise to contact her PCP as well.      Tracey Stewart  PSR III  [facility]

## 2022-02-08 NOTE — Telephone Encounter (Signed)
Called patient and verified ID using 3 patient identifiers.    Pt's PCP ordered a routine referral to Rheumatology at [facility], pt on cancellation list with small private office affiliated with [facility]. PT asking of Dr. Barrington [place] has any pull with [facility].     5/16 chart note mailed to pt's home per her request.     Theodis Sato, RN, BSN, [facility]  ENT Triage  [facility]  [phone]
